# Patient Record
Sex: Male | Born: 1960 | Race: Black or African American | Hispanic: No | Marital: Married | Smoking: Current some day smoker
Health system: Southern US, Community
[De-identification: ages and names within clinical notes are randomized; demographics above are authoritative.]

## PROBLEM LIST (undated history)

## (undated) DIAGNOSIS — N529 Male erectile dysfunction, unspecified: Secondary | ICD-10-CM

## (undated) DIAGNOSIS — L851 Acquired keratosis [keratoderma] palmaris et plantaris: Secondary | ICD-10-CM

## (undated) DIAGNOSIS — M545 Low back pain: Secondary | ICD-10-CM

## (undated) DIAGNOSIS — K219 Gastro-esophageal reflux disease without esophagitis: Secondary | ICD-10-CM

## (undated) DIAGNOSIS — R9431 Abnormal electrocardiogram [ECG] [EKG]: Secondary | ICD-10-CM

## (undated) DIAGNOSIS — E785 Hyperlipidemia, unspecified: Secondary | ICD-10-CM

## (undated) DIAGNOSIS — N4889 Other specified disorders of penis: Secondary | ICD-10-CM

## (undated) DIAGNOSIS — E65 Localized adiposity: Secondary | ICD-10-CM

## (undated) DIAGNOSIS — R634 Abnormal weight loss: Secondary | ICD-10-CM

## (undated) DIAGNOSIS — I10 Essential (primary) hypertension: Secondary | ICD-10-CM

## (undated) DIAGNOSIS — J309 Allergic rhinitis, unspecified: Secondary | ICD-10-CM

## (undated) DIAGNOSIS — L2089 Other atopic dermatitis: Secondary | ICD-10-CM

## (undated) DIAGNOSIS — K259 Gastric ulcer, unspecified as acute or chronic, without hemorrhage or perforation: Secondary | ICD-10-CM

## (undated) HISTORY — DX: Low back pain: M54.5

## (undated) HISTORY — DX: Acquired keratosis (keratoderma) palmaris et plantaris: L85.1

## (undated) HISTORY — DX: Gastro-esophageal reflux disease without esophagitis: K21.9

## (undated) HISTORY — DX: Male erectile dysfunction, unspecified: N52.9

## (undated) HISTORY — DX: Abnormal weight loss: R63.4

## (undated) HISTORY — DX: Other atopic dermatitis: L20.89

## (undated) HISTORY — DX: Localized adiposity: E65

## (undated) HISTORY — DX: Hyperlipidemia, unspecified: E78.5

## (undated) HISTORY — DX: Abnormal electrocardiogram (ECG) (EKG): R94.31

## (undated) HISTORY — DX: Allergic rhinitis, unspecified: J30.9

## (undated) HISTORY — DX: Gastric ulcer, unspecified as acute or chronic, without hemorrhage or perforation: K25.9

## (undated) HISTORY — DX: Essential (primary) hypertension: I10

## (undated) HISTORY — DX: Other specified disorders of penis: N48.89

---

## 2005-01-24 ENCOUNTER — Ambulatory Visit: Payer: Self-pay | Admitting: Internal Medicine

## 2005-01-30 ENCOUNTER — Ambulatory Visit: Payer: Self-pay | Admitting: Internal Medicine

## 2005-06-25 ENCOUNTER — Ambulatory Visit: Payer: Self-pay | Admitting: Internal Medicine

## 2006-03-18 ENCOUNTER — Ambulatory Visit: Payer: Self-pay | Admitting: Internal Medicine

## 2006-04-21 ENCOUNTER — Ambulatory Visit: Payer: Self-pay | Admitting: Internal Medicine

## 2006-04-21 LAB — CONVERTED CEMR LAB: PSA: 1.46 ng/mL

## 2007-03-26 ENCOUNTER — Encounter: Payer: Self-pay | Admitting: Internal Medicine

## 2007-03-29 ENCOUNTER — Encounter: Payer: Self-pay | Admitting: Internal Medicine

## 2007-03-29 DIAGNOSIS — I10 Essential (primary) hypertension: Secondary | ICD-10-CM

## 2007-03-29 DIAGNOSIS — E785 Hyperlipidemia, unspecified: Secondary | ICD-10-CM

## 2007-03-29 DIAGNOSIS — E65 Localized adiposity: Secondary | ICD-10-CM

## 2007-03-29 DIAGNOSIS — M545 Low back pain, unspecified: Secondary | ICD-10-CM | POA: Insufficient documentation

## 2007-03-29 DIAGNOSIS — J309 Allergic rhinitis, unspecified: Secondary | ICD-10-CM | POA: Insufficient documentation

## 2007-03-29 HISTORY — DX: Localized adiposity: E65

## 2007-03-29 HISTORY — DX: Hyperlipidemia, unspecified: E78.5

## 2007-03-29 HISTORY — DX: Essential (primary) hypertension: I10

## 2007-03-29 HISTORY — DX: Low back pain, unspecified: M54.50

## 2007-03-29 HISTORY — DX: Allergic rhinitis, unspecified: J30.9

## 2007-05-26 ENCOUNTER — Ambulatory Visit: Payer: Self-pay | Admitting: Internal Medicine

## 2007-05-26 DIAGNOSIS — N4889 Other specified disorders of penis: Secondary | ICD-10-CM

## 2007-05-26 HISTORY — DX: Other specified disorders of penis: N48.89

## 2007-05-27 LAB — CONVERTED CEMR LAB
ALT: 30 units/L (ref 0–53)
Alkaline Phosphatase: 37 units/L — ABNORMAL LOW (ref 39–117)
BUN: 12 mg/dL (ref 6–23)
Basophils Absolute: 0 10*3/uL (ref 0.0–0.1)
Bilirubin Urine: NEGATIVE
Calcium: 9.5 mg/dL (ref 8.4–10.5)
Cholesterol: 219 mg/dL (ref 0–200)
Direct LDL: 130 mg/dL
Eosinophils Absolute: 0.1 10*3/uL (ref 0.0–0.6)
GFR calc Af Amer: 77 mL/min
GFR calc non Af Amer: 64 mL/min
HDL: 32.3 mg/dL — ABNORMAL LOW (ref >39.0)
Hemoglobin, Urine: NEGATIVE
Hemoglobin: 14.6 g/dL (ref 13.0–17.0)
Lymphocytes Relative: 24.5 % (ref 12.0–46.0)
MCHC: 34.6 g/dL (ref 30.0–36.0)
MCV: 81.3 fL (ref 78.0–100.0)
Monocytes Absolute: 0.5 10*3/uL (ref 0.2–0.7)
Monocytes Relative: 8.3 % (ref 3.0–11.0)
Neutro Abs: 4.4 10*3/uL (ref 1.4–7.7)
Platelets: 253 10*3/uL (ref 150–400)
Potassium: 3.7 meq/L (ref 3.5–5.1)
Specific Gravity, Urine: >=1.03 (ref 1.000–1.03)
TSH: 1.09 microintl units/mL (ref 0.35–5.50)
Total Protein, Urine: NEGATIVE mg/dL
Total Protein: 7.5 g/dL (ref 6.0–8.3)
Triglycerides: 549 mg/dL (ref 0–149)
Urine Glucose: NEGATIVE mg/dL

## 2007-08-27 ENCOUNTER — Ambulatory Visit: Payer: Self-pay | Admitting: Internal Medicine

## 2007-11-02 ENCOUNTER — Telehealth: Payer: Self-pay | Admitting: Internal Medicine

## 2008-01-05 ENCOUNTER — Telehealth (INDEPENDENT_AMBULATORY_CARE_PROVIDER_SITE_OTHER): Payer: Self-pay | Admitting: *Deleted

## 2008-11-08 ENCOUNTER — Telehealth: Payer: Self-pay | Admitting: Internal Medicine

## 2009-02-15 ENCOUNTER — Telehealth (INDEPENDENT_AMBULATORY_CARE_PROVIDER_SITE_OTHER): Payer: Self-pay | Admitting: *Deleted

## 2009-02-20 ENCOUNTER — Telehealth: Payer: Self-pay | Admitting: Internal Medicine

## 2009-02-23 ENCOUNTER — Ambulatory Visit: Payer: Self-pay | Admitting: Internal Medicine

## 2009-02-23 DIAGNOSIS — N529 Male erectile dysfunction, unspecified: Secondary | ICD-10-CM | POA: Insufficient documentation

## 2009-02-23 DIAGNOSIS — R9431 Abnormal electrocardiogram [ECG] [EKG]: Secondary | ICD-10-CM

## 2009-02-23 DIAGNOSIS — R0989 Other specified symptoms and signs involving the circulatory and respiratory systems: Secondary | ICD-10-CM

## 2009-02-23 DIAGNOSIS — R0609 Other forms of dyspnea: Secondary | ICD-10-CM

## 2009-02-23 HISTORY — DX: Male erectile dysfunction, unspecified: N52.9

## 2009-02-23 HISTORY — DX: Abnormal electrocardiogram (ECG) (EKG): R94.31

## 2009-03-13 ENCOUNTER — Telehealth (INDEPENDENT_AMBULATORY_CARE_PROVIDER_SITE_OTHER): Payer: Self-pay | Admitting: Radiology

## 2009-03-14 ENCOUNTER — Ambulatory Visit: Payer: Self-pay

## 2009-03-14 ENCOUNTER — Encounter: Payer: Self-pay | Admitting: Internal Medicine

## 2009-03-15 ENCOUNTER — Encounter: Payer: Self-pay | Admitting: Internal Medicine

## 2009-03-28 ENCOUNTER — Ambulatory Visit: Payer: Self-pay | Admitting: Internal Medicine

## 2010-08-22 ENCOUNTER — Encounter: Payer: Self-pay | Admitting: Internal Medicine

## 2010-08-22 ENCOUNTER — Ambulatory Visit (INDEPENDENT_AMBULATORY_CARE_PROVIDER_SITE_OTHER): Payer: BC Managed Care – PPO | Admitting: Internal Medicine

## 2010-08-22 DIAGNOSIS — I10 Essential (primary) hypertension: Secondary | ICD-10-CM

## 2010-08-22 DIAGNOSIS — L2089 Other atopic dermatitis: Secondary | ICD-10-CM

## 2010-08-22 DIAGNOSIS — N529 Male erectile dysfunction, unspecified: Secondary | ICD-10-CM

## 2010-08-22 HISTORY — DX: Other atopic dermatitis: L20.89

## 2010-08-30 NOTE — Assessment & Plan Note (Signed)
Summary: LAST OV W/DR TLJ:  2010--RASH/UPPER BODY--D/T---STC   Vital Signs:  Patient profile:   50 year old male Height:      72 inches Weight:      237 pounds BMI:     32.26 O2 Sat:      98 % on Room air Temp:     98.3 degrees F oral Pulse rate:   93 / minute Pulse rhythm:   regular Resp:     16 per minute BP sitting:   118 / 78  (left arm) Cuff size:   large  Vitals Entered By: Rock Nephew CMA (August 22, 2010 8:43 AM)  Nutrition Counseling: Patient's BMI is greater than 25 and therefore counseled on weight management options.  O2 Flow:  Room air  Primary Care Provider:  [name] MD  CC:  Rash.  History of Present Illness:  Rash      This is a 50 year old man who presents with Rash.  The symptoms began 4-6 months ago.  The severity is described as mild.  The patient reports papules and itching, but denies macules, nodules, hives, welts, pustules, blisters, ulcers, scaling, weeping, oozing, redness, increased warmth, and tenderness.  The rash is located on the chest.  The rash is worse with heat.  The patient denies the following symptoms: fever, headache, facial swelling, tongue swelling, burning, difficulty breathing, abdominal pain, nausea, vomiting, diarrhea, dizziness, sore throat, dysuria, eye symptoms, and arthralgias.  The patient denies history of recent tick bite, recent tick exposure, other insect bite, recent infection, recent antibiotic use, new medication, new clothing, new topical exposure, recent travel, pet/animal contact, thyroid disease, chronic liver disease, autoimmune disease, chronic edema, and prior STD.  He tells me that he went to see a Dermatologist, [employer], and had ? blood work done and was given ? cream and ? allergy medicine and he says that it did not help. He is scheduled to go back to Dermatology for a biopsy soon. He has not taken any steroids for this.  Preventive Screening-Counseling & Management  Alcohol-Tobacco     Alcohol  drinks/day: 0     Alcohol Counseling: not indicated; patient does not drink     Smoking Status: current     Packs/Day: cigars     Tobacco Counseling: to quit use of tobacco products  Hep-HIV-STD-Contraception     Hepatitis Risk: no risk noted     HIV Risk: no risk noted     STD Risk: no risk noted      Sexual History:  currently monogamous.        Drug Use:  no.        Blood Transfusions:  no.    Clinical Review Panels:  Prevention   Last PSA:  2.03 (05/26/2007)  Immunizations   Last Flu Vaccine:  Fluvax 3+ (04/21/2006)  Lipid Management   Cholesterol:  219 (05/26/2007)   LDL (bad choesterol):  DEL (05/26/2007)   HDL (good cholesterol):  32.3 (05/26/2007)  Diabetes Management   Creatinine:  1.3 (05/26/2007)   Last Flu Vaccine:  Fluvax 3+ (04/21/2006)  CBC   WBC:  6.6 (05/26/2007)   RBC:  5.19 (05/26/2007)   Hgb:  14.6 (05/26/2007)   Hct:  42.2 (05/26/2007)   Platelets:  253 (05/26/2007)   MCV  81.3 (05/26/2007)   MCHC  34.6 (05/26/2007)   RDW  13.8 (05/26/2007)   PMN:  64.4 (05/26/2007)   Lymphs:  24.5 (05/26/2007)   Monos:  8.3 (05/26/2007)  Eosinophils:  2.2 (06/18/07)   Basophil:  0.6 (06/18/2007)  Complete Metabolic Panel   Glucose:  110 (18-Jun-2007)   Sodium:  137 (2007-06-18)   Potassium:  3.7 (2007/06/18)   Chloride:  100 (06/18/07)   CO2:  28 (2007-06-18)   BUN:  12 (06-18-2007)   Creatinine:  1.3 (2007-06-18)   Albumin:  4.4 (06-18-2007)   Total Protein:  7.5 (06/18/07)   Calcium:  9.5 (2007-06-18)   Total Bili:  0.7 (06-18-2007)   Alk Phos:  37 (06-18-2007)   SGPT (ALT):  30 (06-18-07)   SGOT (AST):  25 (18-Jun-2007)   Medications Prior to Update: 1)  Ecotrin Low Strength 81 Mg  Tbec (Aspirin) .Marland Kitchen.. 1 By Mouth Qd 2)  Benicar 40 Mg Tabs (Olmesartan Medoxomil) .... Once Daily For High Blood Pressure 3)  Cialis 5 Mg Tabs (Tadalafil) .... Once Daily As Needed  Current Medications (verified): 1)  Ecotrin Low Strength 81 Mg  Tbec  (Aspirin) .Marland Kitchen.. 1 By Mouth Qd 2)  Benicar 40 Mg Tabs (Olmesartan Medoxomil) .... Once Daily For High Blood Pressure 3)  Medrol (Pak) 4 Mg Tabs (Methylprednisolone) .... Take As Directed 4)  Cetirizine Hcl 10 Mg Tabs (Cetirizine Hcl) .... One By Mouth Once Daily For Itching 5)  Viagra 100 Mg Tabs (Sildenafil Citrate) .... Take As Directed  Allergies (verified): 1)  ! Pcn 2)  ! * Codiene  Past History:  Past Medical History: Last updated: June 18, 2007 Allergic rhinitis Low back pain Obesity Hyperlipidemia Hypertension  Past Surgical History: Last updated: 03/26/2007 Denies surgical history  Family History: Last updated: 2007-06-18 father died with MI 59yo  Social History: Last updated: 02/23/2009 Current Smoker - occ cigar Alcohol use-yes Married Drug use-no Regular exercise-yes  Risk Factors: Alcohol Use: 0 (08/22/2010) Exercise: yes (02/23/2009)  Risk Factors: Smoking Status: current (08/22/2010) Packs/Day: cigars (08/22/2010)  Family History: Reviewed history from 06-18-2007 and no changes required. father died with MI 65yo  Social History: Reviewed history from 02/23/2009 and no changes required. Current Smoker - occ cigar Alcohol use-yes Married Drug use-no Regular exercise-yes Hepatitis Risk:  no risk noted HIV Risk:  no risk noted STD Risk:  no risk noted Sexual History:  currently monogamous Blood Transfusions:  no  Review of Systems       The patient complains of weight gain.  The patient denies anorexia, fever, weight loss, chest pain, syncope, dyspnea on exertion, peripheral edema, prolonged cough, headaches, hemoptysis, abdominal pain, hematuria, suspicious skin lesions, difficulty walking, depression, abnormal bleeding, enlarged lymph nodes, and angioedema.   GU:  Complains of erectile dysfunction; denies decreased libido, discharge, dysuria, genital sores, hematuria, incontinence, nocturia, urinary frequency, and urinary hesitancy.  Physical  Exam  General:  alert, well-developed, well-nourished, well-hydrated, appropriate dress, normal appearance, healthy-appearing, cooperative to examination, good hygiene, and overweight-appearing.   Head:  normocephalic, atraumatic, no abnormalities observed, and no abnormalities palpated.   Eyes:  vision grossly intact, pupils equal, and no injection.   Ears:  R ear normal and L ear normal.   Nose:  External nasal examination shows no deformity or inflammation. Nasal mucosa are pink and moist without lesions or exudates. Mouth:  Oral mucosa and oropharynx without lesions or exudates.  Teeth in good repair. Neck:  supple, full ROM, no masses, no carotid bruits, no cervical lymphadenopathy, and no neck tenderness.   Lungs:  Normal respiratory effort, chest expands symmetrically. Lungs are clear to auscultation, no crackles or wheezes. Heart:  Normal rate and regular rhythm. S1 and S2 normal  without gallop, murmur, click, rub or other extra sounds. Abdomen:  Bowel sounds positive,abdomen soft and non-tender without masses, organomegaly or hernias noted. Msk:  No deformity or scoliosis noted of thoracic or lumbar spine.   Pulses:  R and L carotid,radial,femoral,dorsalis pedis and posterior tibial pulses are full and equal bilaterally Extremities:  No clubbing, cyanosis, edema, or deformity noted with normal full range of motion of all joints.   Neurologic:  No cranial nerve deficits noted. Station and gait are normal. Plantar reflexes are down-going bilaterally. DTRs are symmetrical throughout. Sensory, motor and coordinative functions appear intact. Skin:  he has tiny papules scatterred over his torso but no scale, lichenification, targets, pustules, vesicles, excoriations, erythroderma, exudates, induration, or streaking. Cervical Nodes:  No lymphadenopathy noted Axillary Nodes:  No palpable lymphadenopathy Inguinal Nodes:  No significant adenopathy Psych:  Cognition and judgment appear intact.  Alert and cooperative with normal attention span and concentration. No apparent delusions, illusions, hallucinations   Impression & Recommendations:  Problem # 1:  ECZEMA, ATOPIC (ICD-691.8) Assessment New try medrol dose pak and zyrtec, return to Dermatology for biopsy  Problem # 2:  HYPERTENSION (ICD-401.9) Assessment: Improved  His updated medication list for this problem includes:    Benicar 40 Mg Tabs (Olmesartan medoxomil) ..... Once daily for high blood pressure  BP today: 118/78 Prior BP: 128/92 (02/23/2009)  Prior 10 Yr Risk Heart Disease: Not enough information (02/23/2009)  Labs Reviewed: K+: 3.7 (05/26/2007) Creat: : 1.3 (05/26/2007)   Chol: 219 (05/26/2007)   HDL: 32.3 (05/26/2007)   LDL: DEL (05/26/2007)   TG: 549 (05/26/2007)  Problem # 3:  ERECTILE DYSFUNCTION, SECONDARY TO MEDICATION (UEA-540.98) Assessment: Unchanged  The following medications were removed from the medication list:    Cialis 5 Mg Tabs (Tadalafil) ..... Once daily as needed His updated medication list for this problem includes:    Viagra 100 Mg Tabs (Sildenafil citrate) .Marland Kitchen... Take as directed  Discussed proper use of medications, as well as side effects.   Complete Medication List: 1)  Ecotrin Low Strength 81 Mg Tbec (Aspirin) .Marland Kitchen.. 1 by mouth qd 2)  Benicar 40 Mg Tabs (Olmesartan medoxomil) .... Once daily for high blood pressure 3)  Medrol (pak) 4 Mg Tabs (Methylprednisolone) .... Take as directed 4)  Cetirizine Hcl 10 Mg Tabs (Cetirizine hcl) .... One by mouth once daily for itching 5)  Viagra 100 Mg Tabs (Sildenafil citrate) .... Take as directed  Patient Instructions: 1)  Please schedule a follow-up appointment in 2 weeks. 2)  It is important that you exercise regularly at least 20 minutes 5 times a week. If you develop chest pain, have severe difficulty breathing, or feel very tired , stop exercising immediately and seek medical attention. 3)  You need to lose weight. Consider a lower  calorie diet and regular exercise.  Prescriptions: VIAGRA 100 MG TABS (SILDENAFIL CITRATE) Take as directed  #12 x 11   Entered and Authorized by:   [name] MD   Signed by:   [name] MD on 08/22/2010   Method used:   Print then Give to Patient   RxID:   [id number] CETIRIZINE HCL 10 MG TABS (CETIRIZINE HCL) One by mouth once daily for itching  #30 x 11   Entered and Authorized by:   [name] MD   Signed by:   [name] MD on 08/22/2010   Method used:   [facility] to        [facility]  [address] [facility]  [address] (retail)       [address]       [place], [place]  [address]       Ph: [phone] or [phone]       Fax: [phone]   RxID:   [id number] MEDROL (PAK) 4 MG TABS (METHYLPREDNISOLONE) Take as directed  #1 x 0   Entered and Authorized by:   [name] MD   Signed by:   [name] MD on 08/22/2010   Method used:   [facility]        [phone][facility] (retail)       [address]       [place], [place]  [address]       Ph: [phone] or [phone]       Fax: [phone]   RxID:   [id number]    Orders Added: 1)  Est. Patient Level V [15[address]]

## 2010-09-03 ENCOUNTER — Ambulatory Visit (INDEPENDENT_AMBULATORY_CARE_PROVIDER_SITE_OTHER): Payer: BC Managed Care – PPO | Admitting: Internal Medicine

## 2010-09-03 ENCOUNTER — Encounter: Payer: Self-pay | Admitting: Internal Medicine

## 2010-09-03 DIAGNOSIS — L851 Acquired keratosis [keratoderma] palmaris et plantaris: Secondary | ICD-10-CM

## 2010-09-03 DIAGNOSIS — L2089 Other atopic dermatitis: Secondary | ICD-10-CM

## 2010-09-03 HISTORY — DX: Acquired keratosis (keratoderma) palmaris et plantaris: L85.1

## 2010-09-07 ENCOUNTER — Ambulatory Visit: Payer: BC Managed Care – PPO | Admitting: Internal Medicine

## 2010-09-11 NOTE — Assessment & Plan Note (Signed)
Summary: 2 WK FU [name]  #   Vital Signs:  Patient profile:   50 year old male Height:      72 inches Weight:      236 pounds BMI:     32.12 O2 Sat:      97 % on Room air Temp:     98.4 degrees F oral Pulse rate:   84 / minute Pulse rhythm:   regular Resp:     16 per minute BP sitting:   118 / 70  (left arm) Cuff size:   large  Vitals Entered By: Rock Nephew CMA (September 03, 2010 9:09 AM)  Nutrition Counseling: Patient's BMI is greater than 25 and therefore counseled on weight management options.  O2 Flow:  Room air CC: follow-up visit 2wks, Hypertension Management Is Patient Diabetic? No Pain Assessment Patient in pain? no       Does patient need assistance? Functional Status Self care Ambulation Normal   Primary Care Provider:  [name] MD  CC:  follow-up visit 2wks and Hypertension Management.  History of Present Illness: He returns for f/up and tells me that the itching and scaling has improved but now he is concerned about dark spots across his chest, torso, and back. He is using ? steroid cream given to him by his Dermatologist. The zyrtec helps with the itching.  Hypertension History:      He denies headache, chest pain, palpitations, dyspnea with exertion, orthopnea, PND, peripheral edema, visual symptoms, neurologic problems, syncope, and side effects from treatment.  He notes no problems with any antihypertensive medication side effects.        Positive major cardiovascular risk factors include male age 77 years old or older, hyperlipidemia, hypertension, and current tobacco user.  Negative major cardiovascular risk factors include no history of diabetes and negative family history for ischemic heart disease.        Positive history for target organ damage include cardiac end organ damage (either CHF or LVH).  Further assessment for target organ damage reveals no history of ASHD, stroke/TIA, peripheral vascular disease, renal insufficiency, or hypertensive  retinopathy.    Preventive Screening-Counseling & Management  Alcohol-Tobacco     Alcohol drinks/day: 0     Alcohol Counseling: not indicated; patient does not drink     Smoking Status: current     Packs/Day: cigars     Tobacco Counseling: to quit use of tobacco products  Clinical Review Panels:  Prevention   Last PSA:  2.03 (05/26/2007)  Immunizations   Last Flu Vaccine:  Fluvax 3+ (04/21/2006)  Lipid Management   Cholesterol:  219 (05/26/2007)   LDL (bad choesterol):  DEL (05/26/2007)   HDL (good cholesterol):  32.3 (05/26/2007)  Diabetes Management   Creatinine:  1.3 (05/26/2007)   Last Flu Vaccine:  Fluvax 3+ (04/21/2006)  CBC   WBC:  6.6 (05/26/2007)   RBC:  5.19 (05/26/2007)   Hgb:  14.6 (05/26/2007)   Hct:  42.2 (05/26/2007)   Platelets:  253 (05/26/2007)   MCV  81.3 (05/26/2007)   MCHC  34.6 (05/26/2007)   RDW  13.8 (05/26/2007)   PMN:  64.4 (05/26/2007)   Lymphs:  24.5 (05/26/2007)   Monos:  8.3 (05/26/2007)   Eosinophils:  2.2 (05/26/2007)   Basophil:  0.6 (05/26/2007)  Complete Metabolic Panel   Glucose:  110 (05/26/2007)   Sodium:  137 (05/26/2007)   Potassium:  3.7 (05/26/2007)   Chloride:  100 (05/26/2007)   CO2:  28 (05/26/2007)  BUN:  12 (2007-05-31)   Creatinine:  1.3 (2007-05-31)   Albumin:  4.4 (31-May-2007)   Total Protein:  7.5 (2007-05-31)   Calcium:  9.5 (05-31-07)   Total Bili:  0.7 (05/31/07)   Alk Phos:  37 (May 31, 2007)   SGPT (ALT):  30 (2007/05/31)   SGOT (AST):  25 (May 31, 2007)   Medications Prior to Update: 1)  Ecotrin Low Strength 81 Mg  Tbec (Aspirin) .Marland Kitchen.. 1 By Mouth Qd 2)  Benicar 40 Mg Tabs (Olmesartan Medoxomil) .... Once Daily For High Blood Pressure 3)  Medrol (Pak) 4 Mg Tabs (Methylprednisolone) .... Take As Directed 4)  Cetirizine Hcl 10 Mg Tabs (Cetirizine Hcl) .... One By Mouth Once Daily For Itching 5)  Viagra 100 Mg Tabs (Sildenafil Citrate) .... Take As Directed  Current Medications (verified): 1)   Ecotrin Low Strength 81 Mg  Tbec (Aspirin) .Marland Kitchen.. 1 By Mouth Qd 2)  Benicar 40 Mg Tabs (Olmesartan Medoxomil) .... Once Daily For High Blood Pressure 3)  Medrol (Pak) 4 Mg Tabs (Methylprednisolone) .... Take As Directed 4)  Cetirizine Hcl 10 Mg Tabs (Cetirizine Hcl) .... One By Mouth Once Daily For Itching 5)  Viagra 100 Mg Tabs (Sildenafil Citrate) .... Take As Directed 6)  Lac-Hydrin Twelve 12 % Lotn (Ammonium Lactate) .... Apply To Aa Once Daily Prn  Allergies (verified): 1)  ! Pcn 2)  ! * Codiene  Past History:  Past Medical History: Last updated: May 31, 2007 Allergic rhinitis Low back pain Obesity Hyperlipidemia Hypertension  Past Surgical History: Last updated: 03/26/2007 Denies surgical history  Family History: Last updated: 2007-05-31 father died with MI 59yo  Social History: Last updated: 02/23/2009 Current Smoker - occ cigar Alcohol use-yes Married Drug use-no Regular exercise-yes  Risk Factors: Alcohol Use: 0 (09/03/2010) Exercise: yes (02/23/2009)  Risk Factors: Smoking Status: current (09/03/2010) Packs/Day: cigars (09/03/2010)  Family History: Reviewed history from 2007-05-31 and no changes required. father died with MI 33yo  Social History: Reviewed history from 02/23/2009 and no changes required. Current Smoker - occ cigar Alcohol use-yes Married Drug use-no Regular exercise-yes  Review of Systems Derm:  Complains of changes in color of skin, dryness, and rash; denies changes in nail beds, excessive perspiration, flushing, hair loss, itching, lesion(s), and poor wound healing.  Physical Exam  General:  alert, well-developed, well-nourished, well-hydrated, appropriate dress, normal appearance, healthy-appearing, cooperative to examination, good hygiene, and overweight-appearing.   Mouth:  Oral mucosa and oropharynx without lesions or exudates.  Teeth in good repair. Neck:  supple, full ROM, no masses, no carotid bruits, no cervical  lymphadenopathy, and no neck tenderness.   Lungs:  Normal respiratory effort, chest expands symmetrically. Lungs are clear to auscultation, no crackles or wheezes. Heart:  Normal rate and regular rhythm. S1 and S2 normal without gallop, murmur, click, rub or other extra sounds. Abdomen:  Bowel sounds positive,abdomen soft and non-tender without masses, organomegaly or hernias noted. Msk:  No deformity or scoliosis noted of thoracic or lumbar spine.   Pulses:  R and L carotid,radial,femoral,dorsalis pedis and posterior tibial pulses are full and equal bilaterally Extremities:  No clubbing, cyanosis, edema, or deformity noted with normal full range of motion of all joints.   Neurologic:  No cranial nerve deficits noted. Station and gait are normal. Plantar reflexes are down-going bilaterally. DTRs are symmetrical throughout. Sensory, motor and coordinative functions appear intact. Skin:  most of the scale has resolved but now he has perifollicular PIPA across his chest and torso. Psych:  Cognition and judgment appear intact.  Alert and cooperative with normal attention span and concentration. 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 Apply to aa once daily prn  Problem # 3:  HYPERTENSION (ICD-401.9) Assessment: Improved  His updated medication list for this problem includes:    Benicar 40 Mg Tabs (Olmesartan medoxomil) ..... Once daily for high blood pressure  BP today: 118/70 Prior BP: 118/78 (08/22/2010)  Prior 10 Yr Risk Heart Disease: Not enough information (02/23/2009)  Labs Reviewed: K+: 3.7 (05/26/2007) Creat: : 1.3 (05/26/2007)   Chol: 219 (05/26/2007)   HDL: 32.3  (05/26/2007)   LDL: DEL (05/26/2007)   TG: 549 (05/26/2007)  Complete Medication List: 1)  Ecotrin Low Strength 81 Mg Tbec (Aspirin) .Marland Kitchen.. 1 by mouth qd 2)  Benicar 40 Mg Tabs (Olmesartan medoxomil) .... Once daily for high blood pressure 3)  Medrol (pak) 4 Mg Tabs (Methylprednisolone) .... Take as directed 4)  Cetirizine Hcl 10 Mg Tabs (Cetirizine hcl) .... One by mouth once daily for itching 5)  Viagra 100 Mg Tabs (Sildenafil citrate) .... Take as directed 6)  Lac-hydrin Twelve 12 % Lotn (Ammonium lactate) .... Apply to aa once daily prn  Hypertension Assessment/Plan:      The patient's hypertensive risk group is category C: Target organ damage and/or diabetes.  Today's blood pressure is 118/70.  His blood pressure goal is < 140/90.   Patient Instructions: 1)  Please schedule a follow-up appointment in 2 months. 2)  It is important that you exercise regularly at least 20 minutes 5 times a week. If you develop chest pain, have severe difficulty breathing, or feel very tired , stop exercising immediately and seek medical attention. 3)  You need to lose weight. Consider a lower calorie diet and regular exercise.  Prescriptions: LAC-HYDRIN TWELVE 12 % LOTN (AMMONIUM LACTATE) Apply to AA once daily prn  #1 bottle x 11   Entered and Authorized by:   Howard Grandchild MD   Signed by:   Howard Grandchild MD on 09/03/2010   Method used:   [phone][facility] (retail)       [address]       [place], [place]  [address]       Ph: [phone] or [phone]       Fax: [phone]   RxID:   [id number]    Orders Added: 1)  Est. Patient Level IV [59563]

## 2011-02-10 ENCOUNTER — Other Ambulatory Visit: Payer: Self-pay | Admitting: Internal Medicine

## 2011-06-27 ENCOUNTER — Ambulatory Visit (INDEPENDENT_AMBULATORY_CARE_PROVIDER_SITE_OTHER): Payer: 59 | Admitting: Internal Medicine

## 2011-06-27 ENCOUNTER — Other Ambulatory Visit (INDEPENDENT_AMBULATORY_CARE_PROVIDER_SITE_OTHER): Payer: 59

## 2011-06-27 ENCOUNTER — Other Ambulatory Visit: Payer: Self-pay | Admitting: Internal Medicine

## 2011-06-27 ENCOUNTER — Encounter: Payer: Self-pay | Admitting: Internal Medicine

## 2011-06-27 VITALS — BP 126/78 | HR 86 | Temp 97.6°F | Resp 16

## 2011-06-27 DIAGNOSIS — E785 Hyperlipidemia, unspecified: Secondary | ICD-10-CM

## 2011-06-27 DIAGNOSIS — Z0000 Encounter for general adult medical examination without abnormal findings: Secondary | ICD-10-CM

## 2011-06-27 DIAGNOSIS — N400 Enlarged prostate without lower urinary tract symptoms: Secondary | ICD-10-CM

## 2011-06-27 DIAGNOSIS — I10 Essential (primary) hypertension: Secondary | ICD-10-CM

## 2011-06-27 DIAGNOSIS — Z23 Encounter for immunization: Secondary | ICD-10-CM

## 2011-06-27 DIAGNOSIS — R10817 Generalized abdominal tenderness: Secondary | ICD-10-CM

## 2011-06-27 LAB — CBC WITH DIFFERENTIAL/PLATELET
Basophils Absolute: 0 10*3/uL (ref 0.0–0.1)
HCT: 42.1 % (ref 39.0–52.0)
Lymphs Abs: 0.9 10*3/uL (ref 0.7–4.0)
MCV: 83.1 fl (ref 78.0–100.0)
Monocytes Absolute: 0.5 10*3/uL (ref 0.1–1.0)
Monocytes Relative: 9.1 % (ref 3.0–12.0)
Neutrophils Relative %: 74.2 % (ref 43.0–77.0)
Platelets: 209 10*3/uL (ref 150.0–400.0)
RDW: 14.6 % (ref 11.5–14.6)

## 2011-06-27 LAB — URINALYSIS, ROUTINE W REFLEX MICROSCOPIC
Ketones, ur: NEGATIVE
Specific Gravity, Urine: 1.02 (ref 1.000–1.030)
Total Protein, Urine: NEGATIVE
Urine Glucose: NEGATIVE
pH: 7 (ref 5.0–8.0)

## 2011-06-27 LAB — COMPREHENSIVE METABOLIC PANEL
ALT: 32 U/L (ref 0–53)
Alkaline Phosphatase: 43 U/L (ref 39–117)
Sodium: 140 mEq/L (ref 135–145)
Total Bilirubin: 0.6 mg/dL (ref 0.3–1.2)
Total Protein: 6.6 g/dL (ref 6.0–8.3)

## 2011-06-27 LAB — LIPID PANEL
Cholesterol: 167 mg/dL (ref 0–200)
HDL: 35.9 mg/dL — ABNORMAL LOW (ref >39.00)
Total CHOL/HDL Ratio: 5
Triglycerides: 365 mg/dL — ABNORMAL HIGH (ref 0.0–149.0)

## 2011-06-27 LAB — LIPASE: Lipase: 29 U/L (ref 11.0–59.0)

## 2011-06-27 LAB — AMYLASE: Amylase: 60 U/L (ref 27–131)

## 2011-06-27 NOTE — Patient Instructions (Signed)

## 2011-06-27 NOTE — Progress Notes (Signed)
Subjective:    Patient ID: Howard [name], male    DOB: 23-Jul-1960, 51 y.o.   MRN: [id number]  Abdominal Pain This is a new problem. Episode onset: one month. The onset quality is gradual. The problem occurs intermittently. The most recent episode lasted 1 month. The problem has been unchanged. The pain is located in the epigastric region. The pain is at a severity of 2/10. The pain is mild. The quality of the pain is aching and burning. The abdominal pain does not radiate. Associated symptoms include weight loss. Pertinent negatives include no anorexia, arthralgias, belching, constipation, diarrhea, dysuria, fever, flatus, frequency, headaches, hematochezia, hematuria, melena, myalgias, nausea or vomiting. The pain is aggravated by nothing. The pain is relieved by eating. He has tried nothing for the symptoms.  Hypertension This is a chronic problem. The current episode started more than 1 year ago. The problem has been gradually improving since onset. The problem is controlled. Pertinent negatives include no anxiety, blurred vision, chest pain, headaches, malaise/fatigue, neck pain, orthopnea, palpitations, peripheral edema, PND, shortness of breath or sweats. There are no associated agents to hypertension. Past treatments include angiotensin blockers and diuretics. The current treatment provides significant improvement. Compliance problems include exercise and diet.       Review of Systems  Constitutional: Positive for weight loss. Negative for fever, chills, malaise/fatigue, diaphoresis, activity change, appetite change, fatigue and unexpected weight change.  HENT: Negative for nosebleeds, congestion, sore throat, facial swelling, rhinorrhea, trouble swallowing, neck pain, neck stiffness, voice change and ear discharge.   Eyes: Negative.  Negative for blurred vision.  Respiratory: Negative for cough, chest tightness, shortness of breath, wheezing and stridor.   Cardiovascular: Negative for  chest pain, palpitations, orthopnea, leg swelling and PND.  Gastrointestinal: Positive for abdominal pain. Negative for nausea, vomiting, diarrhea, constipation, blood in stool, melena, hematochezia, abdominal distention, anal bleeding, rectal pain, anorexia and flatus.  Genitourinary: Negative for dysuria, urgency, frequency, hematuria, flank pain, decreased urine volume, discharge, penile swelling, scrotal swelling, enuresis, difficulty urinating, genital sores, penile pain and testicular pain.  Musculoskeletal: Negative for myalgias, back pain, joint swelling, arthralgias and gait problem.  Skin: Negative for color change, pallor, rash and wound.  Neurological: Negative for dizziness, tremors, seizures, syncope, facial asymmetry, speech difficulty, light-headedness, numbness and headaches.  Hematological: Negative for adenopathy. Does not bruise/bleed easily.  Psychiatric/Behavioral: Negative.        Objective:   Physical Exam  Vitals reviewed. Constitutional: He is oriented to person, place, and time. He appears well-developed and well-nourished. No distress.  HENT:  Head: Normocephalic and atraumatic.  Mouth/Throat: Oropharynx is clear and moist. No oropharyngeal exudate.  Eyes: Conjunctivae are normal. Right eye exhibits no discharge. No scleral icterus.  Neck: Normal range of motion. Neck supple. No JVD present. No tracheal deviation present. No thyromegaly present.  Cardiovascular: Normal rate, regular rhythm, normal heart sounds and intact distal pulses.  Exam reveals no gallop and no friction rub.   No murmur heard. Pulmonary/Chest: Effort normal and breath sounds normal. No stridor. No respiratory distress. He has no wheezes. He has no rales. He exhibits no tenderness.  Abdominal: Soft. Bowel sounds are normal. He exhibits no distension and no mass. There is no tenderness. There is no rebound and no guarding. Hernia confirmed negative in the right inguinal area and confirmed  negative in the left inguinal area.  Genitourinary: Rectum normal, prostate normal, testes normal and penis normal. Rectal exam shows no external hemorrhoid, no internal hemorrhoid, no fissure, no mass,  no tenderness and anal tone normal. Guaiac negative stool. Prostate is not enlarged and not tender. Right testis shows no mass, no swelling and no tenderness. Right testis is descended. Left testis shows no mass, no swelling and no tenderness. Left testis is descended. Uncircumcised. No phimosis, paraphimosis, hypospadias, penile erythema or penile tenderness. No discharge found.  Musculoskeletal: Normal range of motion. He exhibits no edema and no tenderness.  Lymphadenopathy:    He has no cervical adenopathy.       Right: No inguinal adenopathy present.       Left: No inguinal adenopathy present.  Neurological: He is oriented to person, place, and time.  Skin: Skin is warm and dry. No rash noted. He is not diaphoretic. No erythema. No pallor.  Psychiatric: He has a normal mood and affect. His behavior is normal. Judgment and thought content normal.      Lab Results  Component Value Date   WBC 6.6 05/26/2007   HGB 14.6 05/26/2007   HCT 42.2 05/26/2007   PLT 253 05/26/2007   GLUCOSE 110* 05/26/2007   CHOL 219* 05/26/2007   TRIG 549* 05/26/2007   HDL 32.3* 05/26/2007   LDLDIRECT 130.0 05/26/2007   ALT 30 05/26/2007   AST 25 05/26/2007   NA 137 05/26/2007   K 3.7 05/26/2007   CL 100 05/26/2007   CREATININE 1.3 05/26/2007   BUN 12 05/26/2007   CO2 28 05/26/2007   TSH 1.09 05/26/2007   PSA 2.03 05/26/2007      Assessment & Plan:

## 2011-06-28 ENCOUNTER — Encounter: Payer: Self-pay | Admitting: Internal Medicine

## 2011-06-30 DIAGNOSIS — Z0000 Encounter for general adult medical examination without abnormal findings: Secondary | ICD-10-CM | POA: Insufficient documentation

## 2011-06-30 NOTE — Assessment & Plan Note (Signed)
Exam done, labs ordered, vaccines updated

## 2011-06-30 NOTE — Assessment & Plan Note (Signed)
I will recheck his lipids today  

## 2011-06-30 NOTE — Assessment & Plan Note (Signed)
I will check his PSA level today 

## 2011-06-30 NOTE — Assessment & Plan Note (Signed)
He has had a vague abd pain for about one month, I do not see anything on exam today to explain this, I will check his labs today to look for systemic/orgainc causes

## 2011-06-30 NOTE — Assessment & Plan Note (Signed)
His BP is well controlled, I will check his lytes and renal function today 

## 2011-07-02 ENCOUNTER — Telehealth: Payer: Self-pay

## 2011-07-02 NOTE — Telephone Encounter (Signed)
Called [facility] at [phone], spoke with Molly Maduro who approved Benicar HCT (case ID [id number]). Patient notified

## 2011-09-03 ENCOUNTER — Other Ambulatory Visit: Payer: Self-pay | Admitting: Internal Medicine

## 2011-09-27 ENCOUNTER — Encounter: Payer: Self-pay | Admitting: Internal Medicine

## 2011-09-27 ENCOUNTER — Ambulatory Visit (INDEPENDENT_AMBULATORY_CARE_PROVIDER_SITE_OTHER): Payer: 59 | Admitting: Internal Medicine

## 2011-09-27 ENCOUNTER — Other Ambulatory Visit (INDEPENDENT_AMBULATORY_CARE_PROVIDER_SITE_OTHER): Payer: 59

## 2011-09-27 VITALS — BP 114/68 | HR 82 | Temp 98.4°F | Resp 16 | Wt 215.0 lb

## 2011-09-27 DIAGNOSIS — R6881 Early satiety: Secondary | ICD-10-CM

## 2011-09-27 DIAGNOSIS — R10817 Generalized abdominal tenderness: Secondary | ICD-10-CM

## 2011-09-27 DIAGNOSIS — E785 Hyperlipidemia, unspecified: Secondary | ICD-10-CM

## 2011-09-27 DIAGNOSIS — I10 Essential (primary) hypertension: Secondary | ICD-10-CM

## 2011-09-27 LAB — COMPREHENSIVE METABOLIC PANEL
AST: 21 U/L (ref 0–37)
Alkaline Phosphatase: 42 U/L (ref 39–117)
BUN: 12 mg/dL (ref 6–23)
Creatinine, Ser: 1.3 mg/dL (ref 0.4–1.5)
Glucose, Bld: 97 mg/dL (ref 70–99)
Potassium: 3.7 mEq/L (ref 3.5–5.1)
Total Bilirubin: 0.6 mg/dL (ref 0.3–1.2)

## 2011-09-27 LAB — CBC WITH DIFFERENTIAL/PLATELET
Basophils Relative: 0.5 % (ref 0.0–3.0)
Eosinophils Absolute: 0.2 10*3/uL (ref 0.0–0.7)
Eosinophils Relative: 2.5 % (ref 0.0–5.0)
HCT: 43.8 % (ref 39.0–52.0)
Lymphs Abs: 1.3 10*3/uL (ref 0.7–4.0)
MCHC: 33.4 g/dL (ref 30.0–36.0)
MCV: 82.3 fl (ref 78.0–100.0)
Monocytes Absolute: 0.7 10*3/uL (ref 0.1–1.0)
Neutro Abs: 4.9 10*3/uL (ref 1.4–7.7)
Neutrophils Relative %: 68.6 % (ref 43.0–77.0)
RBC: 5.33 Mil/uL (ref 4.22–5.81)

## 2011-09-27 LAB — AMYLASE: Amylase: 57 U/L (ref 27–131)

## 2011-09-27 LAB — LDL CHOLESTEROL, DIRECT: Direct LDL: 90.3 mg/dL

## 2011-09-27 LAB — LIPASE: Lipase: 27 U/L (ref 11.0–59.0)

## 2011-09-27 MED ORDER — ESOMEPRAZOLE MAGNESIUM 40 MG PO CPDR: 40.0000 mg | DELAYED_RELEASE_CAPSULE | Freq: Every day | ORAL | Status: DC

## 2011-09-27 NOTE — Patient Instructions (Signed)

## 2011-09-27 NOTE — Progress Notes (Signed)
Subjective:    Patient ID: Howard [name], male    DOB: 07/31/1959, 51 y.o.   MRN: [id number]  Abdominal Pain This is a recurrent problem. The current episode started more than 1 month ago. The onset quality is gradual. The problem occurs intermittently. The problem has been unchanged. The pain is located in the epigastric region. The pain is at a severity of 2/10. The pain is mild. The quality of the pain is dull. The abdominal pain does not radiate. Associated symptoms include anorexia, melena and weight loss. Pertinent negatives include no arthralgias, belching, constipation, diarrhea, dysuria, fever, flatus, frequency, headaches, hematochezia, hematuria, myalgias, nausea or vomiting. The pain is aggravated by nothing. Relieved by: pepto-bismol. He has tried antacids for the symptoms. The treatment provided moderate relief.      Review of Systems  Constitutional: Positive for weight loss and unexpected weight change (intentional weight loss with herbal diet meds). Negative for fever, chills, diaphoresis, activity change, appetite change and fatigue.  HENT: Negative.   Eyes: Negative.   Respiratory: Negative for apnea, cough, choking, chest tightness, shortness of breath, wheezing and stridor.   Cardiovascular: Negative for chest pain and leg swelling.  Gastrointestinal: Positive for abdominal pain, melena and anorexia. Negative for nausea, vomiting, diarrhea, constipation, blood in stool, hematochezia, abdominal distention, anal bleeding, rectal pain and flatus.  Genitourinary: Negative for dysuria, urgency, frequency, hematuria, flank pain, decreased urine volume, discharge, penile swelling, scrotal swelling, enuresis, difficulty urinating, genital sores, penile pain and testicular pain.  Musculoskeletal: Negative for myalgias, back pain, joint swelling, arthralgias and gait problem.  Skin: Negative for color change, pallor, rash and wound.  Neurological: Negative for dizziness, tremors,  seizures, syncope, facial asymmetry, speech difficulty, weakness, light-headedness, numbness and headaches.  Hematological: Negative for adenopathy. Does not bruise/bleed easily.  Psychiatric/Behavioral: Negative.        Objective:   Physical Exam  Vitals reviewed. Constitutional: He is oriented to person, place, and time. He appears well-developed and well-nourished. No distress.  HENT:  Head: Normocephalic and atraumatic.  Mouth/Throat: Oropharynx is clear and moist. No oropharyngeal exudate.  Eyes: Conjunctivae are normal. Right eye exhibits no discharge. Left eye exhibits no discharge. No scleral icterus.  Neck: Normal range of motion. Neck supple. No JVD present. No tracheal deviation present. No thyromegaly present.  Cardiovascular: Normal rate, regular rhythm, normal heart sounds and intact distal pulses.  Exam reveals no gallop and no friction rub.   No murmur heard. Pulmonary/Chest: Effort normal and breath sounds normal. No stridor. No respiratory distress. He has no wheezes. He has no rales. He exhibits no tenderness.  Abdominal: Soft. Bowel sounds are normal. He exhibits no distension and no mass. There is no tenderness. There is no rebound and no guarding.  Genitourinary:       This exam was refused by him  Musculoskeletal: Normal range of motion. He exhibits no edema and no tenderness.  Lymphadenopathy:    He has no cervical adenopathy.  Neurological: He is oriented to person, place, and time.  Skin: Skin is warm and dry. No rash noted. He is not diaphoretic. No erythema. No pallor.  Psychiatric: He has a normal mood and affect. His behavior is normal. Judgment and thought content normal.      Lab Results  Component Value Date   WBC 6.0 06/27/2011   HGB 14.2 06/27/2011   HCT 42.1 06/27/2011   PLT 209.0 06/27/2011   GLUCOSE 93 06/27/2011   CHOL 167 06/27/2011   TRIG 365.0* 06/27/2011  HDL 35.90* 06/27/2011   LDLDIRECT 69.1 06/27/2011   ALT 32 06/27/2011   AST 26 06/27/2011   NA  140 06/27/2011   K 4.1 06/27/2011   CL 102 06/27/2011   CREATININE 1.2 06/27/2011   BUN 16 06/27/2011   CO2 30 06/27/2011   TSH 1.37 06/27/2011   PSA 4.76* 06/27/2011      Assessment & Plan:

## 2011-09-29 NOTE — Assessment & Plan Note (Signed)
I will recheck his FLP today to see if his trigs are high enough to cause pancreatitis

## 2011-09-29 NOTE — Assessment & Plan Note (Addendum)
I will check his labs today to look for organic issues and I have asked him to see GI to consider having an upper endoscopy done, I am concerned that he may have PUD or GERD so I have started him on a PPI as well

## 2011-09-29 NOTE — Assessment & Plan Note (Signed)
I will check his labs today to look for orgainc pathology (anemia, pancreatitis, etc.)

## 2011-09-29 NOTE — Assessment & Plan Note (Signed)
His BP is well controlled, I will check his lytes and renal function today 

## 2011-10-07 ENCOUNTER — Encounter: Payer: Self-pay | Admitting: Gastroenterology

## 2011-10-07 ENCOUNTER — Encounter: Payer: Self-pay | Admitting: Internal Medicine

## 2011-10-07 ENCOUNTER — Ambulatory Visit (INDEPENDENT_AMBULATORY_CARE_PROVIDER_SITE_OTHER): Payer: 59 | Admitting: Internal Medicine

## 2011-10-07 ENCOUNTER — Other Ambulatory Visit (INDEPENDENT_AMBULATORY_CARE_PROVIDER_SITE_OTHER): Payer: 59

## 2011-10-07 ENCOUNTER — Ambulatory Visit (INDEPENDENT_AMBULATORY_CARE_PROVIDER_SITE_OTHER)
Admission: RE | Admit: 2011-10-07 | Discharge: 2011-10-07 | Disposition: A | Discharge status: Home / Self Care | Payer: 59 | Source: Ambulatory Visit | Attending: Internal Medicine | Admitting: Internal Medicine

## 2011-10-07 VITALS — BP 92/60 | HR 99 | Temp 98.2°F | Resp 16 | Wt 213.5 lb

## 2011-10-07 DIAGNOSIS — R10817 Generalized abdominal tenderness: Secondary | ICD-10-CM

## 2011-10-07 DIAGNOSIS — R6881 Early satiety: Secondary | ICD-10-CM

## 2011-10-07 DIAGNOSIS — J309 Allergic rhinitis, unspecified: Secondary | ICD-10-CM

## 2011-10-07 LAB — COMPREHENSIVE METABOLIC PANEL
ALT: 30 U/L (ref 0–53)
AST: 25 U/L (ref 0–37)
Alkaline Phosphatase: 38 U/L — ABNORMAL LOW (ref 39–117)
BUN: 13 mg/dL (ref 6–23)
Calcium: 9.1 mg/dL (ref 8.4–10.5)
Chloride: 103 mEq/L (ref 96–112)
Creatinine, Ser: 1.4 mg/dL (ref 0.4–1.5)
Potassium: 4 mEq/L (ref 3.5–5.1)

## 2011-10-07 LAB — CBC WITH DIFFERENTIAL/PLATELET
Basophils Absolute: 0 10*3/uL (ref 0.0–0.1)
Basophils Relative: 0.3 % (ref 0.0–3.0)
Eosinophils Absolute: 0.2 10*3/uL (ref 0.0–0.7)
Lymphocytes Relative: 19.1 % (ref 12.0–46.0)
MCHC: 33.5 g/dL (ref 30.0–36.0)
MCV: 82 fl (ref 78.0–100.0)
Monocytes Absolute: 0.6 10*3/uL (ref 0.1–1.0)
Neutrophils Relative %: 66.7 % (ref 43.0–77.0)
RDW: 14.5 % (ref 11.5–14.6)

## 2011-10-07 LAB — URINALYSIS, ROUTINE W REFLEX MICROSCOPIC
Bilirubin Urine: NEGATIVE
Hgb urine dipstick: NEGATIVE
Ketones, ur: NEGATIVE
Leukocytes, UA: NEGATIVE
Nitrite: NEGATIVE
Specific Gravity, Urine: 1.015 (ref 1.000–1.030)
Total Protein, Urine: NEGATIVE
Urine Glucose: NEGATIVE
Urobilinogen, UA: 0.2 (ref 0.0–1.0)
pH: 6 (ref 5.0–8.0)

## 2011-10-07 LAB — AMYLASE: Amylase: 60 U/L (ref 27–131)

## 2011-10-07 MED ORDER — IOHEXOL 300 MG/ML  SOLN
100.0000 mL | Freq: Once | INTRAMUSCULAR | Status: AC | PRN
  Administered 2011-10-07: 100 mL via INTRAVENOUS

## 2011-10-07 MED ORDER — METHYLPREDNISOLONE 4 MG PO KIT: PACK | ORAL | Status: AC

## 2011-10-07 NOTE — Assessment & Plan Note (Signed)
Start medrol dose pak 

## 2011-10-07 NOTE — Assessment & Plan Note (Signed)
He needs to have ct scan done to look for pancreatitis, mass, renal stones, liver mass, gallstones, etc

## 2011-10-07 NOTE — Patient Instructions (Signed)

## 2011-10-07 NOTE — Progress Notes (Signed)
Subjective:    Patient ID: Howard [name], male    DOB: 07/31/1959, 51 y.o.   MRN: [id number]  Abdominal Pain This is a recurrent problem. The current episode started more than 1 month ago. The onset quality is gradual. The problem occurs intermittently. The most recent episode lasted 6 weeks. The problem has been unchanged. The pain is located in the RUQ. The pain is at a severity of 3/10. The pain is mild. The quality of the pain is aching. The abdominal pain does not radiate. Associated symptoms include nausea, vomiting and weight loss. Pertinent negatives include no anorexia, arthralgias, belching, constipation, diarrhea, dysuria, fever, flatus, frequency, headaches, hematochezia, hematuria, melena or myalgias. The pain is aggravated by nothing. The pain is relieved by nothing. He has tried proton pump inhibitors for the symptoms. The treatment provided no relief.  Allergic Reaction This is a recurrent problem. The current episode started more than 1 week ago. The problem occurs intermittently. The problem has been gradually worsening since onset. The problem is moderate. Associated with: pollen. The time of exposure was just prior to onset. Associated symptoms include abdominal pain and vomiting. Pertinent negatives include no chest pain, chest pressure, coughing, diarrhea, difficulty breathing, drooling, eye itching, eye redness, eye watering, globus sensation, hyperventilation, rash, stridor, trouble swallowing or wheezing. There is no swelling present. Past treatments include one or more OTC medications. The treatment provided no relief. His past medical history is significant for seasonal allergies.      Review of Systems  Constitutional: Positive for weight loss and unexpected weight change. Negative for fever, chills, diaphoresis, activity change, appetite change and fatigue.  HENT: Positive for congestion, rhinorrhea, sneezing and postnasal drip. Negative for hearing loss, ear pain,  nosebleeds, sore throat, facial swelling, drooling, mouth sores, trouble swallowing, neck pain, neck stiffness, dental problem, sinus pressure, tinnitus and ear discharge.   Eyes: Negative.  Negative for redness and itching.  Respiratory: Negative for cough, chest tightness, shortness of breath, wheezing and stridor.   Cardiovascular: Negative for chest pain, palpitations and leg swelling.  Gastrointestinal: Positive for nausea, vomiting and abdominal pain. Negative for diarrhea, constipation, blood in stool, melena, hematochezia, abdominal distention, anal bleeding, rectal pain, anorexia and flatus.  Genitourinary: Negative for dysuria, urgency, frequency, hematuria, flank pain, decreased urine volume, discharge, penile swelling, scrotal swelling, enuresis, difficulty urinating, genital sores, penile pain and testicular pain.  Musculoskeletal: Negative for myalgias, back pain, joint swelling, arthralgias and gait problem.  Skin: Negative for color change, pallor, rash and wound.  Neurological: Negative for headaches.  Hematological: Negative for adenopathy. Does not bruise/bleed easily.  Psychiatric/Behavioral: Negative.        Objective:   Physical Exam  Vitals reviewed. Constitutional: He is oriented to person, place, and time. He appears well-developed and well-nourished. No distress.  HENT:  Head: Normocephalic and atraumatic.  Right Ear: Hearing, tympanic membrane, external ear and ear canal normal.  Left Ear: Hearing, tympanic membrane, external ear and ear canal normal.  Nose: Mucosal edema and rhinorrhea present. No nose lacerations, sinus tenderness, nasal deformity, septal deviation or nasal septal hematoma. No epistaxis.  No foreign bodies. Right sinus exhibits no maxillary sinus tenderness and no frontal sinus tenderness. Left sinus exhibits no maxillary sinus tenderness.  Mouth/Throat: Oropharynx is clear and moist and mucous membranes are normal. Mucous membranes are not pale,  not dry and not cyanotic. No oropharyngeal exudate, posterior oropharyngeal edema, posterior oropharyngeal erythema or tonsillar abscesses.  Eyes: Conjunctivae are normal. Right eye exhibits no discharge.  Left eye exhibits no discharge. No scleral icterus.  Neck: Normal range of motion. Neck supple. No JVD present. No tracheal deviation present. No thyromegaly present.  Cardiovascular: Normal rate, regular rhythm, normal heart sounds and intact distal pulses.  Exam reveals no gallop and no friction rub.   No murmur heard. Pulmonary/Chest: Effort normal and breath sounds normal. No stridor. No respiratory distress. He has no wheezes. He has no rales. He exhibits no tenderness.  Abdominal: Soft. Normal appearance and bowel sounds are normal. He exhibits no shifting dullness, no distension, no pulsatile liver, no fluid wave, no abdominal bruit, no ascites, no pulsatile midline mass and no mass. There is no hepatosplenomegaly. There is tenderness in the right upper quadrant. There is no rigidity, no rebound, no guarding, no CVA tenderness, no tenderness at McBurney's point and negative Murphy's sign. No hernia. Hernia confirmed negative in the ventral area.  Genitourinary:       This exam was refused by him  Musculoskeletal: Normal range of motion. He exhibits no edema and no tenderness.  Lymphadenopathy:    He has no cervical adenopathy.  Neurological: He is oriented to person, place, and time.  Skin: Skin is warm and dry. No rash noted. He is not diaphoretic. No erythema. No pallor.  Psychiatric: He has a normal mood and affect. His behavior is normal. Judgment and thought content normal.     Lab Results  Component Value Date   WBC 7.1 09/27/2011   HGB 14.6 09/27/2011   HCT 43.8 09/27/2011   PLT 233.0 09/27/2011   GLUCOSE 97 09/27/2011   CHOL 174 09/27/2011   TRIG 289.0* 09/27/2011   HDL 37.50* 09/27/2011   LDLDIRECT 90.3 09/27/2011   ALT 32 09/27/2011   AST 21 09/27/2011   NA 141 09/27/2011   K 3.7 09/27/2011     CL 104 09/27/2011   CREATININE 1.3 09/27/2011   BUN 12 09/27/2011   CO2 27 09/27/2011   TSH 1.37 06/27/2011   PSA 4.76* 06/27/2011       Assessment & Plan:

## 2011-10-10 ENCOUNTER — Other Ambulatory Visit: Payer: Self-pay | Admitting: *Deleted

## 2011-10-10 DIAGNOSIS — R6881 Early satiety: Secondary | ICD-10-CM

## 2011-10-10 MED ORDER — ESOMEPRAZOLE MAGNESIUM 40 MG PO CPDR: 40.0000 mg | DELAYED_RELEASE_CAPSULE | Freq: Every day | ORAL | Status: DC

## 2011-10-10 NOTE — Telephone Encounter (Signed)
Done. Pt informed.

## 2011-10-11 ENCOUNTER — Ambulatory Visit: Payer: 59 | Admitting: Internal Medicine

## 2011-11-05 ENCOUNTER — Ambulatory Visit (INDEPENDENT_AMBULATORY_CARE_PROVIDER_SITE_OTHER): Payer: 59 | Admitting: Gastroenterology

## 2011-11-05 ENCOUNTER — Encounter: Payer: Self-pay | Admitting: Gastroenterology

## 2011-11-05 VITALS — BP 110/64 | HR 80 | Ht 72.0 in | Wt 202.0 lb

## 2011-11-05 DIAGNOSIS — R112 Nausea with vomiting, unspecified: Secondary | ICD-10-CM

## 2011-11-05 DIAGNOSIS — R634 Abnormal weight loss: Secondary | ICD-10-CM

## 2011-11-05 DIAGNOSIS — R1013 Epigastric pain: Secondary | ICD-10-CM

## 2011-11-05 MED ORDER — OMEPRAZOLE 20 MG PO CPDR: 20.0000 mg | DELAYED_RELEASE_CAPSULE | Freq: Every day | ORAL | Status: DC

## 2011-11-05 MED ORDER — ONDANSETRON HCL 4 MG PO TABS: 4.0000 mg | ORAL_TABLET | Freq: Three times a day (TID) | ORAL | Status: DC | PRN

## 2011-11-05 MED ORDER — ONDANSETRON HCL 4 MG PO TABS: 4.0000 mg | ORAL_TABLET | Freq: Three times a day (TID) | ORAL | Status: AC | PRN

## 2011-11-05 NOTE — Progress Notes (Signed)
Addended by: Harlow Mares D on: 11/05/2011 10:40 AM   Modules accepted: Orders

## 2011-11-05 NOTE — Progress Notes (Signed)
History of Present Illness:  This is a 51 year old African American male referred for evaluation of 2 months of epigastric abdominal pain with constant nausea and intermittent vomiting of partially digested food products. Patient was on diet and apparently felt that he had a" pulled muscle" and was taken a fair amount of NSAIDs. Subsequent to this had known epigastric discomfort which does awaken him from sleep, but has not been improved by 2 weeks of Nexium therapy. The patient does relate black tarry stools, but review of recent labs shows normal CBC a metabolic profile. Also CT scan of the abdomen was unremarkable. The patient denies acid reflux, dysphagia, history of hepatitis, pancreatitis, alcohol or cigarette abuse. In the last 2 months he has lost 35 pounds in weight, partially voluntarily. Patient not had previous endoscopy or colonoscopy. His father died of prostate cancer. The patient continues without any appetite without any specific food intolerances.  I have reviewed this patient's present history, medical and surgical past history, allergies and medications.     ROS: The remainder of the 10 point ROS is negative     Physical Exam: Healthy-appearing in no acute distress. Blood pressure 110/64, pulse 80 and regular, and weight 202 pounds. BMI is 27.40 General well developed well nourished patient in no acute distress, appearing their stated age Eyes PERRLA, no icterus, fundoscopic exam per opthamologist Skin no lesions noted Neck supple, no adenopathy, no thyroid enlargement, no tenderness Chest clear to percussion and auscultation Heart no significant murmurs, gallops or rubs noted Abdomen no hepatosplenomegaly masses or tenderness, BS normal. I cannot appreciate a succussion splash.  Rectal inspection normal no fissures, or fistulae noted.  No masses or tenderness on digital exam. Stool guaiac positive. His prostate is somewhat firm an enlarged left lobe greater than  right. Extremities no acute joint lesions, edema, phlebitis or evidence of cellulitis. Neurologic patient oriented x 3, cranial nerves intact, no focal neurologic deficits noted. Psychological mental status normal and normal affect.  Assessment and plan: Symptoms suggestive of partial gastric outlet obstruction possibly from NSAID ulceration, rule out H. pylori infection, carcinoma, or other structural abnormalities. I've scheduled endoscopy ASAP around his schedule. We have placed him on Prilosec 20 mg a day with when necessary Zofran 4 mg every 8 hours when necessary pending his endoscopic evaluation. At some point he also needs screening colonoscopy. Dr. Yetta Barre is following his enlarged prostate and increased PSA level. He may need urologic referral.  No diagnosis found.

## 2011-11-05 NOTE — Patient Instructions (Signed)
Your procedure has been scheduled for 11/08/2011, please follow the seperate instructions.  Your prescription(s) have been sent to you pharmacy, Zofran, Prilosec.  Take Prilosec once a day 30 min before breakfast Take Zofran as needed for nausea.

## 2011-11-08 ENCOUNTER — Other Ambulatory Visit: Payer: Self-pay | Admitting: Gastroenterology

## 2011-11-08 ENCOUNTER — Ambulatory Visit (AMBULATORY_SURGERY_CENTER): Payer: 59 | Admitting: Gastroenterology

## 2011-11-08 ENCOUNTER — Encounter: Payer: Self-pay | Admitting: Gastroenterology

## 2011-11-08 VITALS — BP 114/68 | HR 88 | Temp 99.1°F | Resp 16 | Ht 72.0 in | Wt 202.0 lb

## 2011-11-08 DIAGNOSIS — R634 Abnormal weight loss: Secondary | ICD-10-CM

## 2011-11-08 DIAGNOSIS — K259 Gastric ulcer, unspecified as acute or chronic, without hemorrhage or perforation: Secondary | ICD-10-CM

## 2011-11-08 DIAGNOSIS — R112 Nausea with vomiting, unspecified: Secondary | ICD-10-CM

## 2011-11-08 DIAGNOSIS — K279 Peptic ulcer, site unspecified, unspecified as acute or chronic, without hemorrhage or perforation: Secondary | ICD-10-CM

## 2011-11-08 DIAGNOSIS — R1013 Epigastric pain: Secondary | ICD-10-CM

## 2011-11-08 HISTORY — DX: Gastric ulcer, unspecified as acute or chronic, without hemorrhage or perforation: K25.9

## 2011-11-08 MED ORDER — OMEPRAZOLE 20 MG PO CPDR: 20.0000 mg | DELAYED_RELEASE_CAPSULE | Freq: Two times a day (BID) | ORAL | Status: DC

## 2011-11-08 MED ORDER — SODIUM CHLORIDE 0.9 % IV SOLN: 500.0000 mL | INTRAVENOUS | Status: DC

## 2011-11-08 NOTE — Op Note (Signed)
[facility] [address] [place], [place]  [address]  ENDOSCOPY PROCEDURE REPORT  PATIENT:  [name], Howard  MR#:  [id number] BIRTHDATE:  07/31/1959, 52 yrs. old  GENDER:  male  ENDOSCOPIST:  Vania Rea. Jarold Motto, MD, [facility] Referred by:  [name], M.D.  PROCEDURE DATE:  11/08/2011 PROCEDURE:  EGD with biopsy for H. pylori 30865 ASA CLASS:  Class II INDICATIONS:  nausea and vomiting  MEDICATIONS:   propofol (Diprivan) 120 mg IV TOPICAL ANESTHETIC:  DESCRIPTION OF PROCEDURE:   After the risks and benefits of the procedure were explained, informed consent was obtained.  The LB GIF-H180 [id number] endoscope was introduced through the mouth and advanced to the second portion of the duodenum.  The instrument was slowly withdrawn as the mucosa was fully examined. <<PROCEDUREIMAGES>>  Multiple ulcers were found in the body and the antrum of the stomach. large shallow ulceration extending into the pelorus with bleeding and pyloric deformity.CLO BX. DONE.  Normal duodenal folds were noted.  The esophagus and gastroesophageal junction were completely normal in appearance.    Retroflexed views revealed no abnormalities.    The scope was then withdrawn from the patient and the procedure completed.  COMPLICATIONS:  None  ENDOSCOPIC IMPRESSION: 1) Ulcers, multiple in the body and the antrum of the stomach 2) Normal duodenal folds 3) Normal esophagus PROBABLE NSAID ULCERS,R/O H.PYLORI. RECOMMENDATIONS: 1) Avoid NSAIDS for two weeks NEXIUM 40G/DAY  ______________________________ Vania Rea. Jarold Motto, MD, [facility]  CC:  n. eSIGNED:   Vania Rea. Wynelle Dreier at 11/08/2011 01:55 PM  Miguel Aschoff, [phone]

## 2011-11-08 NOTE — Patient Instructions (Signed)
Avoid NSAIDS for two weeks, until Friday, Nov 22, 2011.  You may resume your other prior medications today.  Please call if you have any problems or questions.    YOU HAD AN ENDOSCOPIC PROCEDURE TODAY AT THE [facility]: Refer to the procedure report that was given to you for any specific questions about what was found during the examination.  If the procedure report does not answer your questions, please call your gastroenterologist to clarify.  If you requested that your care partner not be given the details of your procedure findings, then the procedure report has been included in a sealed envelope for you to review at your convenience later.  YOU SHOULD EXPECT: Some feelings of bloating in the abdomen. Passage of more gas than usual.  Walking can help get rid of the air that was put into your GI tract during the procedure and reduce the bloating. If you had a lower endoscopy (such as a colonoscopy or flexible sigmoidoscopy) you may notice spotting of blood in your stool or on the toilet paper. If you underwent a bowel prep for your procedure, then you may not have a normal bowel movement for a few days.  DIET: Your first meal following the procedure should be a light meal and then it is ok to progress to your normal diet.  A half-sandwich or bowl of soup is an example of a good first meal.  Heavy or fried foods are harder to digest and may make you feel nauseous or bloated.  Likewise meals heavy in dairy and vegetables can cause extra gas to form and this can also increase the bloating.  Drink plenty of fluids but you should avoid alcoholic beverages for 24 hours.  ACTIVITY: Your care partner should take you home directly after the procedure.  You should plan to take it easy, moving slowly for the rest of the day.  You can resume normal activity the day after the procedure however you should NOT DRIVE or use heavy machinery for 24 hours (because of the sedation medicines used during the  test).    SYMPTOMS TO REPORT IMMEDIATELY: A gastroenterologist can be reached at any hour.  During normal business hours, 8:30 AM to 5:00 PM Monday through Friday, call [phone].  After hours and on weekends, please call the GI answering service at [phone] who will take a message and have the physician on call contact you.     Following upper endoscopy (EGD)  Vomiting of blood or coffee ground material  New chest pain or pain under the shoulder blades  Painful or persistently difficult swallowing  New shortness of breath  Fever of 100F or higher  Black, tarry-looking stools  FOLLOW UP: If any biopsies were taken you will be contacted by phone or by letter within the next 1-3 weeks.  Call your gastroenterologist if you have not heard about the biopsies in 3 weeks.  Our staff will call the home number listed on your records the next business day following your procedure to check on you and address any questions or concerns that you may have at that time regarding the information given to you following your procedure. This is a courtesy call and so if there is no answer at the home number and we have not heard from you through the emergency physician on call, we will assume that you have returned to your regular daily activities without incident.  SIGNATURES/CONFIDENTIALITY: You and/or your care partner have signed paperwork which  will be entered into your electronic medical record.  These signatures attest to the fact that that the information above on your After Visit Summary has been reviewed and is understood.  Full responsibility of the confidentiality of this discharge information lies with you and/or your care-partner.

## 2011-11-08 NOTE — Progress Notes (Signed)
No complaints noted in the recovery room. Maw  Patient did not experience any of the following events: a burn prior to discharge; a fall within the facility; wrong site/side/patient/procedure/implant event; or a hospital transfer or hospital admission upon discharge from the facility. (G8907) Patient did not have preoperative order for IV antibiotic SSI prophylaxis. (G8918)  

## 2011-11-11 ENCOUNTER — Encounter: Payer: Self-pay | Admitting: Gastroenterology

## 2011-11-11 ENCOUNTER — Telehealth: Payer: Self-pay

## 2011-11-11 LAB — HELICOBACTER PYLORI SCREEN-BIOPSY: UREASE: NEGATIVE

## 2011-11-11 NOTE — Telephone Encounter (Signed)
  Follow up Call-  Call back number 11/08/2011  Post procedure Call Back phone  # [phone]  Permission to leave phone message Yes     Patient questions:  Do you have a fever, pain , or abdominal swelling? no Pain Score  0 *  Have you tolerated food without any problems? yes  Have you been able to return to your normal activities? yes  Do you have any questions about your discharge instructions: Diet   no Medications  no Follow up visit  no  Do you have questions or concerns about your Care? no  Actions: * If pain score is 4 or above: No action needed, pain <4.

## 2011-11-15 ENCOUNTER — Ambulatory Visit: Payer: 59 | Admitting: Gastroenterology

## 2011-11-22 ENCOUNTER — Telehealth: Payer: Self-pay | Admitting: Gastroenterology

## 2011-11-22 MED ORDER — ONDANSETRON HCL 4 MG PO TABS: ORAL_TABLET | ORAL | Status: DC

## 2011-11-22 NOTE — Telephone Encounter (Signed)
Notified pt I ordered his zofran.

## 2011-12-04 ENCOUNTER — Encounter: Payer: Self-pay | Admitting: *Deleted

## 2011-12-12 ENCOUNTER — Encounter: Payer: Self-pay | Admitting: Gastroenterology

## 2011-12-12 ENCOUNTER — Other Ambulatory Visit: Payer: Self-pay | Admitting: Internal Medicine

## 2011-12-12 ENCOUNTER — Ambulatory Visit (INDEPENDENT_AMBULATORY_CARE_PROVIDER_SITE_OTHER): Payer: 59 | Admitting: Gastroenterology

## 2011-12-12 VITALS — BP 146/92 | HR 80 | Ht 72.0 in | Wt 199.5 lb

## 2011-12-12 DIAGNOSIS — I10 Essential (primary) hypertension: Secondary | ICD-10-CM

## 2011-12-12 DIAGNOSIS — Z8719 Personal history of other diseases of the digestive system: Secondary | ICD-10-CM

## 2011-12-12 DIAGNOSIS — Z800 Family history of malignant neoplasm of digestive organs: Secondary | ICD-10-CM

## 2011-12-12 MED ORDER — OMEPRAZOLE 20 MG PO CPDR: 20.0000 mg | DELAYED_RELEASE_CAPSULE | Freq: Two times a day (BID) | ORAL | Status: DC

## 2011-12-12 NOTE — Progress Notes (Signed)
This is a 51 year old African American male he recently had endoscopy because of abdominal pain and nausea and vomiting. There multiple gastric ulcerations related to NSAID use with a negative biopsy for H. pylori. He is currently on Prilosec 20 mg twice a day and is 75% better. Over the course of his illness he has lost approximately 30 pounds. His weight is now stable. He denies any specific food intolerances, and has no further nausea and vomiting or hepatobiliary complaints. Previous imaging of his upper abdomen showed no evidence of cholelithiasis or other intra-abdominal abnormalities. He is on Benicar HCTZ for hypertension. He denies a current cardiovascular pulmonary complaints.  Current Medications, Allergies, Past Medical History, Past Surgical History, Family History and Social History were reviewed in [employer] record.  Pertinent Review of Systems Negative   Physical Exam: Healthy patient in no distress. Blood pressure 146/92, pulse 80 and regular, and weight under 99 pounds with BMI of 27.06. I cannot appreciate stigmata of chronic liver disease. Chest is clear he is in a regular rhythm without murmurs gallops or rubs. There is no organomegaly, abdominal masses, or tenderness. There is no succussion splash. Peripheral extremities are unremarkable mental status is normal.    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 We will continue his current medications with office followup in 2 months time. The patient has a family history of colon cancer in his father, and needs to be scheduled for screening colonoscopy. He otherwise is to continue his medications as listed and reviewed per primary care. No diagnosis found.

## 2011-12-12 NOTE — Patient Instructions (Addendum)
You have been given a separate informational sheet regarding your tobacco use, the importance of quitting and local resources to help you quit. Your prescription for Prilosec has been refilled for 1 year. Please follow up with Dr Jarold Motto in 2 months.

## 2012-02-06 ENCOUNTER — Encounter: Payer: Self-pay | Admitting: *Deleted

## 2012-02-13 ENCOUNTER — Ambulatory Visit: Payer: 59 | Admitting: Gastroenterology

## 2012-02-13 ENCOUNTER — Other Ambulatory Visit: Payer: Self-pay | Admitting: Internal Medicine

## 2012-04-16 ENCOUNTER — Other Ambulatory Visit: Payer: Self-pay | Admitting: Gastroenterology

## 2012-11-18 ENCOUNTER — Encounter: Payer: Self-pay | Admitting: Internal Medicine

## 2012-11-18 ENCOUNTER — Other Ambulatory Visit: Payer: Self-pay | Admitting: Internal Medicine

## 2012-11-18 ENCOUNTER — Other Ambulatory Visit (INDEPENDENT_AMBULATORY_CARE_PROVIDER_SITE_OTHER): Payer: 59

## 2012-11-18 ENCOUNTER — Ambulatory Visit (INDEPENDENT_AMBULATORY_CARE_PROVIDER_SITE_OTHER): Payer: 59 | Admitting: Internal Medicine

## 2012-11-18 VITALS — BP 98/62 | HR 92 | Temp 98.9°F | Resp 16 | Wt 186.0 lb

## 2012-11-18 DIAGNOSIS — I10 Essential (primary) hypertension: Secondary | ICD-10-CM

## 2012-11-18 DIAGNOSIS — IMO0001 Reserved for inherently not codable concepts without codable children: Secondary | ICD-10-CM

## 2012-11-18 DIAGNOSIS — K529 Noninfective gastroenteritis and colitis, unspecified: Secondary | ICD-10-CM | POA: Insufficient documentation

## 2012-11-18 DIAGNOSIS — K5289 Other specified noninfective gastroenteritis and colitis: Secondary | ICD-10-CM

## 2012-11-18 DIAGNOSIS — N400 Enlarged prostate without lower urinary tract symptoms: Secondary | ICD-10-CM

## 2012-11-18 LAB — CBC WITH DIFFERENTIAL/PLATELET
Basophils Relative: 0.3 % (ref 0.0–3.0)
Eosinophils Absolute: 0.4 10*3/uL (ref 0.0–0.7)
Eosinophils Relative: 2.3 % (ref 0.0–5.0)
HCT: 40.6 % (ref 39.0–52.0)
Lymphs Abs: 1.1 10*3/uL (ref 0.7–4.0)
MCHC: 33.9 g/dL (ref 30.0–36.0)
MCV: 82.2 fl (ref 78.0–100.0)
Monocytes Absolute: 0.8 10*3/uL (ref 0.1–1.0)
Platelets: 378 10*3/uL (ref 150.0–400.0)
RBC: 4.94 Mil/uL (ref 4.22–5.81)
WBC: 17.1 10*3/uL — ABNORMAL HIGH (ref 4.5–10.5)

## 2012-11-18 LAB — COMPREHENSIVE METABOLIC PANEL
AST: 23 U/L (ref 0–37)
Albumin: 3.1 g/dL — ABNORMAL LOW (ref 3.5–5.2)
Alkaline Phosphatase: 88 U/L (ref 39–117)
Glucose, Bld: 89 mg/dL (ref 70–99)
Potassium: 4 mEq/L (ref 3.5–5.1)
Sodium: 138 mEq/L (ref 135–145)
Total Bilirubin: 0.8 mg/dL (ref 0.3–1.2)
Total Protein: 6.4 g/dL (ref 6.0–8.3)

## 2012-11-18 MED ORDER — METHYLPREDNISOLONE ACETATE 80 MG/ML IJ SUSP
80.0000 mg | Freq: Once | INTRAMUSCULAR | Status: AC
  Administered 2012-11-18: 80 mg via INTRAMUSCULAR

## 2012-11-18 MED ORDER — METHYLPREDNISOLONE ACETATE 40 MG/ML IJ SUSP
40.0000 mg | Freq: Once | INTRAMUSCULAR | Status: AC
  Administered 2012-11-18: 40 mg via INTRAMUSCULAR

## 2012-11-18 MED ORDER — CIPROFLOXACIN HCL 500 MG PO TABS: 500.0000 mg | ORAL_TABLET | Freq: Two times a day (BID) | ORAL | Status: DC

## 2012-11-18 NOTE — Patient Instructions (Signed)
Viral Gastroenteritis Viral gastroenteritis is also known as stomach flu. This condition affects the stomach and intestinal tract. It can cause sudden diarrhea and vomiting. The illness typically lasts 3 to 8 days. Most people develop an immune response that eventually gets rid of the virus. While this natural response develops, the virus can make you quite ill. CAUSES  Many different viruses can cause gastroenteritis, such as rotavirus or noroviruses. You can catch one of these viruses by consuming contaminated food or water. You may also catch a virus by sharing utensils or other personal items with an infected person or by touching a contaminated surface. SYMPTOMS  The most common symptoms are diarrhea and vomiting. These problems can cause a severe loss of body fluids (dehydration) and a body salt (electrolyte) imbalance. Other symptoms may include:  Fever.  Headache.  Fatigue.  Abdominal pain. DIAGNOSIS  Your caregiver can usually diagnose viral gastroenteritis based on your symptoms and a physical exam. A stool sample may also be taken to test for the presence of viruses or other infections. TREATMENT  This illness typically goes away on its own. Treatments are aimed at rehydration. The most serious cases of viral gastroenteritis involve vomiting so severely that you are not able to keep fluids down. In these cases, fluids must be given through an intravenous line (IV). HOME CARE INSTRUCTIONS   Drink enough fluids to keep your urine clear or pale yellow. Drink small amounts of fluids frequently and increase the amounts as tolerated.  Ask your caregiver for specific rehydration instructions.  Avoid:  Foods high in sugar.  Alcohol.  Carbonated drinks.  Tobacco.  Juice.  Caffeine drinks.  Extremely hot or cold fluids.  Fatty, greasy foods.  Too much intake of anything at one time.  Dairy products until 24 to 48 hours after diarrhea stops.  You may consume probiotics.  Probiotics are active cultures of beneficial bacteria. They may lessen the amount and number of diarrheal stools in adults. Probiotics can be found in yogurt with active cultures and in supplements.  Wash your hands well to avoid spreading the virus.  Only take over-the-counter or prescription medicines for pain, discomfort, or fever as directed by your caregiver. Do not give aspirin to children. Antidiarrheal medicines are not recommended.  Ask your caregiver if you should continue to take your regular prescribed and over-the-counter medicines.  Keep all follow-up appointments as directed by your caregiver. SEEK IMMEDIATE MEDICAL CARE IF:   You are unable to keep fluids down.  You do not urinate at least once every 6 to 8 hours.  You develop shortness of breath.  You notice blood in your stool or vomit. This may look like coffee grounds.  You have abdominal pain that increases or is concentrated in one small area (localized).  You have persistent vomiting or diarrhea.  You have a fever.  The patient is a child younger than 3 months, and he or she has a fever.  The patient is a child older than 3 months, and he or she has a fever and persistent symptoms.  The patient is a child older than 3 months, and he or she has a fever and symptoms suddenly get worse.  The patient is a baby, and he or she has no tears when crying. MAKE SURE YOU:   Understand these instructions.  Will watch your condition.  Will get help right away if you are not doing well or get worse. Document Released: 06/10/2005 Document Revised: 09/02/2011 Document Reviewed: 03/27/2011   ExitCare Patient Information 2014 [employer].  

## 2012-11-18 NOTE — Assessment & Plan Note (Signed)
His BP is low and he feels lightheaded so I have asked him to stop the Benicar-HCT I will check his lytes and renal function today

## 2012-11-18 NOTE — Assessment & Plan Note (Signed)
I will recheck his PSA today 

## 2012-11-18 NOTE — Assessment & Plan Note (Signed)
It appears that the infection has resolved I will check his labs today to see if he has developed any complications

## 2012-11-18 NOTE — Progress Notes (Signed)
  Subjective:    Patient ID: Howard [name], male    DOB: 07/31/1959, 52 y.o.   MRN: [id number]  HPI  He developed watery diarrhea with N/V 10 days ago. Those symptoms have resolved but now he complains of diffuse weakness, myalgias, and fatigue.  Review of Systems  Constitutional: Positive for chills and fatigue. Negative for fever, diaphoresis, activity change, appetite change and unexpected weight change.  HENT: Negative.   Eyes: Negative.   Respiratory: Negative.  Negative for cough, chest tightness, shortness of breath, wheezing and stridor.   Cardiovascular: Negative.  Negative for chest pain, palpitations and leg swelling.  Gastrointestinal: Negative for nausea, vomiting, abdominal pain, diarrhea, constipation and blood in stool.  Endocrine: Negative.   Genitourinary: Negative.   Musculoskeletal: Positive for myalgias. Negative for back pain, arthralgias and gait problem.  Skin: Negative.  Negative for color change, pallor, rash and wound.  Allergic/Immunologic: Negative.   Neurological: Positive for weakness and light-headedness. Negative for dizziness, tremors, syncope, facial asymmetry, speech difficulty, numbness and headaches.  Hematological: Negative.  Negative for adenopathy. Does not bruise/bleed easily.  Psychiatric/Behavioral: Negative.        Objective:   Physical Exam  Vitals reviewed. Constitutional: He is oriented to person, place, and time. Vital signs are normal. He appears well-developed and well-nourished.  Non-toxic appearance. He does not have a sickly appearance. He does not appear ill. He appears distressed (he moves slowly and whinces in pain with movement).  HENT:  Head: Normocephalic and atraumatic.  Mouth/Throat: Oropharynx is clear and moist. No oropharyngeal exudate.  Eyes: Conjunctivae are normal. Right eye exhibits no discharge. Left eye exhibits no discharge. No scleral icterus.  Neck: Normal range of motion. Neck supple. No JVD present. No  tracheal deviation present. No thyromegaly present.  Cardiovascular: Normal rate, regular rhythm, normal heart sounds and intact distal pulses.  Exam reveals no gallop and no friction rub.   No murmur heard. Pulmonary/Chest: Effort normal and breath sounds normal. No stridor. No respiratory distress. He has no wheezes. He has no rales. He exhibits no tenderness.  Abdominal: Soft. Bowel sounds are normal. He exhibits no distension and no mass. There is no tenderness. There is no rebound and no guarding.  Musculoskeletal: Normal range of motion. He exhibits no edema.  Lymphadenopathy:    He has no cervical adenopathy.  Neurological: He is oriented to person, place, and time. He has normal reflexes. He displays normal reflexes. He exhibits normal muscle tone. Coordination normal.  Skin: Skin is warm and dry. No rash noted. He is not diaphoretic. No erythema. No pallor.  Psychiatric: He has a normal mood and affect. His behavior is normal. Judgment and thought content normal.      Lab Results  Component Value Date   WBC 6.0 10/07/2011   HGB 15.0 10/07/2011   HCT 44.8 10/07/2011   PLT 217.0 10/07/2011   GLUCOSE 95 10/07/2011   CHOL 174 09/27/2011   TRIG 289.0* 09/27/2011   HDL 37.50* 09/27/2011   LDLDIRECT 90.3 09/27/2011   ALT 30 10/07/2011   AST 25 10/07/2011   NA 140 10/07/2011   K 4.0 10/07/2011   CL 103 10/07/2011   CREATININE 1.4 10/07/2011   BUN 13 10/07/2011   CO2 26 10/07/2011   TSH 1.37 06/27/2011   PSA 4.76* 06/27/2011      Assessment & Plan:

## 2012-11-18 NOTE — Assessment & Plan Note (Signed)
He can't take nsaids due to a history of PUD so I gave him an injection of depo-medrol IM for symptom relief I will check his labs today to look for myopathy, rhabdo, inflammation, abnormal lytes, renal failure, etc.

## 2012-11-23 ENCOUNTER — Telehealth: Payer: Self-pay | Admitting: Internal Medicine

## 2012-11-23 MED ORDER — TRAMADOL HCL 50 MG PO TABS: 50.0000 mg | ORAL_TABLET | Freq: Three times a day (TID) | ORAL | Status: DC | PRN

## 2012-11-23 NOTE — Telephone Encounter (Signed)
I need a pharmacy

## 2012-11-23 NOTE — Telephone Encounter (Signed)
Please advise on continued pain for pt, OTC medication not working and pt out of town. Thanks

## 2012-11-23 NOTE — Telephone Encounter (Signed)
Pharmacy has been updated in Epic, [facility] [address] Thanks

## 2012-11-23 NOTE — Telephone Encounter (Signed)
The patient called stating he was still having severe joint pain.  He found a over the counter joint medicine called 'arthriten'.  He stated he was out of town till Thursday, so he did make an appointment to follow up with Dr.Jones on Friday. Patient's call back - #[phone]

## 2012-11-23 NOTE — Telephone Encounter (Signed)
done

## 2012-11-27 ENCOUNTER — Telehealth: Payer: Self-pay | Admitting: Internal Medicine

## 2012-11-27 ENCOUNTER — Ambulatory Visit (INDEPENDENT_AMBULATORY_CARE_PROVIDER_SITE_OTHER): Payer: 59 | Admitting: Internal Medicine

## 2012-11-27 ENCOUNTER — Other Ambulatory Visit (INDEPENDENT_AMBULATORY_CARE_PROVIDER_SITE_OTHER): Payer: 59

## 2012-11-27 ENCOUNTER — Encounter: Payer: Self-pay | Admitting: Internal Medicine

## 2012-11-27 VITALS — BP 110/68 | HR 99 | Temp 98.7°F | Resp 16 | Wt 193.0 lb

## 2012-11-27 DIAGNOSIS — I10 Essential (primary) hypertension: Secondary | ICD-10-CM

## 2012-11-27 DIAGNOSIS — M2550 Pain in unspecified joint: Secondary | ICD-10-CM

## 2012-11-27 LAB — RHEUMATOID FACTOR: Rhuematoid fact SerPl-aCnc: <10 IU/mL (ref <=14)

## 2012-11-27 LAB — CBC WITH DIFFERENTIAL/PLATELET
Basophils Absolute: 0.1 10*3/uL (ref 0.0–0.1)
Hemoglobin: 12.1 g/dL — ABNORMAL LOW (ref 13.0–17.0)
Lymphocytes Relative: 10.2 % — ABNORMAL LOW (ref 12.0–46.0)
Monocytes Relative: 7.8 % (ref 3.0–12.0)
Neutro Abs: 8.5 10*3/uL — ABNORMAL HIGH (ref 1.4–7.7)
Neutrophils Relative %: 79.3 % — ABNORMAL HIGH (ref 43.0–77.0)
Platelets: 414 10*3/uL — ABNORMAL HIGH (ref 150.0–400.0)
RDW: 14.9 % — ABNORMAL HIGH (ref 11.5–14.6)

## 2012-11-27 LAB — CK: Total CK: 51 U/L (ref 7–232)

## 2012-11-27 LAB — SEDIMENTATION RATE: Sed Rate: 38 mm/hr — ABNORMAL HIGH (ref 0–22)

## 2012-11-27 MED ORDER — DICLOFENAC 35 MG PO CAPS: 1.0000 | ORAL_CAPSULE | Freq: Three times a day (TID) | ORAL | Status: DC | PRN

## 2012-11-27 NOTE — Telephone Encounter (Signed)
RX resent

## 2012-11-27 NOTE — Progress Notes (Signed)
Subjective:    Patient ID: Howard [name], male    DOB: 07/31/1959, 52 y.o.   MRN: [id number]  Arthritis Presents for follow-up visit. He complains of pain and stiffness. He reports no joint swelling or joint warmth. Affected locations include the right shoulder, left shoulder, right hip, left hip, left PIP, right PIP, right MCP, right DIP, left MCP and left DIP. His pain is at a severity of 4/10. Associated symptoms include fatigue, pain at night and pain while resting. Pertinent negatives include no diarrhea, dry eyes, dry mouth, dysuria, fever, rash, Raynaud's syndrome, uveitis or weight loss. Compliance with total regimen is 26-50%.      Review of Systems  Constitutional: Positive for fatigue. Negative for fever, chills, weight loss, diaphoresis, activity change, appetite change and unexpected weight change.  HENT: Negative.   Eyes: Negative.   Respiratory: Negative.  Negative for cough, chest tightness, shortness of breath, wheezing and stridor.   Cardiovascular: Negative.  Negative for chest pain, palpitations and leg swelling.  Gastrointestinal: Negative for nausea, vomiting, abdominal pain, diarrhea, constipation, abdominal distention, anal bleeding and rectal pain.  Endocrine: Negative.   Genitourinary: Negative.  Negative for dysuria.  Musculoskeletal: Positive for arthralgias, arthritis and stiffness. Negative for myalgias, back pain, joint swelling and gait problem.  Skin: Negative.  Negative for color change, pallor, rash and wound.  Allergic/Immunologic: Negative.   Neurological: Negative.   Hematological: Negative.  Negative for adenopathy. Does not bruise/bleed easily.  Psychiatric/Behavioral: Negative.        Objective:   Physical Exam  Vitals reviewed. Constitutional: He is oriented to person, place, and time. He appears well-developed and well-nourished.  Non-toxic appearance. He does not have a sickly appearance. He does not appear ill. No distress.  HENT:  Head:  Normocephalic and atraumatic.  Mouth/Throat: Oropharynx is clear and moist. No oropharyngeal exudate.  Eyes: Conjunctivae are normal. Right eye exhibits no discharge. Left eye exhibits no discharge. No scleral icterus.  Neck: Normal range of motion. Neck supple. No JVD present. No tracheal deviation present. No thyromegaly present.  Cardiovascular: Normal rate, regular rhythm, normal heart sounds and intact distal pulses.  Exam reveals no gallop and no friction rub.   No murmur heard. Pulmonary/Chest: Effort normal and breath sounds normal. No stridor. No respiratory distress. He has no wheezes. He has no rales. He exhibits no tenderness.  Abdominal: Soft. Bowel sounds are normal. He exhibits no distension and no mass. There is no tenderness. There is no rebound and no guarding.  Musculoskeletal: Normal range of motion. He exhibits no edema and no tenderness.       Right wrist: Normal. He exhibits normal range of motion, no tenderness, no bony tenderness, no swelling, no effusion, no crepitus, no deformity and no laceration.       Left wrist: Normal. He exhibits normal range of motion, no tenderness, no bony tenderness, no swelling, no effusion, no crepitus, no deformity and no laceration.       Right hip: Normal.       Left hip: Normal.       Right knee: Normal. He exhibits no swelling, no effusion and no erythema. No tenderness found.       Left knee: He exhibits no swelling, no effusion and no erythema. No tenderness found.       Right hand: Normal. He exhibits no tenderness, no bony tenderness and no swelling.       Left hand: He exhibits no tenderness, no bony tenderness and no swelling.  Lymphadenopathy:    He has no cervical adenopathy.  Neurological: He is oriented to person, place, and time.  Skin: Skin is warm and dry. No rash noted. He is not diaphoretic. No erythema. No pallor.  Psychiatric: He has a normal mood and affect. His behavior is normal. Judgment and thought content normal.      Lab Results  Component Value Date   WBC 17.1* 11/18/2012   HGB 13.8 11/18/2012   HCT 40.6 11/18/2012   PLT 378.0 11/18/2012   GLUCOSE 89 11/18/2012   CHOL 174 09/27/2011   TRIG 289.0* 09/27/2011   HDL 37.50* 09/27/2011   LDLDIRECT 90.3 09/27/2011   ALT 40 11/18/2012   AST 23 11/18/2012   NA 138 11/18/2012   K 4.0 11/18/2012   CL 101 11/18/2012   CREATININE 1.2 11/18/2012   BUN 14 11/18/2012   CO2 25 11/18/2012   TSH 1.63 11/18/2012   PSA 5.03* 11/18/2012       Assessment & Plan:

## 2012-11-27 NOTE — Telephone Encounter (Signed)
Pt called stated that Zorvolex need to be resend to [facility] on [address]. Please resend.

## 2012-11-27 NOTE — Assessment & Plan Note (Signed)
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

## 2012-11-27 NOTE — Patient Instructions (Signed)
Arthralgia Your caregiver has diagnosed you as suffering from an arthralgia. Arthralgia means there is pain in a joint. This can come from many reasons including:  Bruising the joint which causes soreness (inflammation) in the joint.  Wear and tear on the joints which occur as we grow older (osteoarthritis).  Overusing the joint.  Various forms of arthritis.  Infections of the joint. Regardless of the cause of pain in your joint, most of these different pains respond to anti-inflammatory drugs and rest. The exception to this is when a joint is infected, and these cases are treated with antibiotics, if it is a bacterial infection. HOME CARE INSTRUCTIONS   Rest the injured area for as long as directed by your caregiver. Then slowly start using the joint as directed by your caregiver and as the pain allows. Crutches as directed may be useful if the ankles, knees or hips are involved. If the knee was splinted or casted, continue use and care as directed. If an stretchy or elastic wrapping bandage has been applied today, it should be removed and re-applied every 3 to 4 hours. It should not be applied tightly, but firmly enough to keep swelling down. Watch toes and feet for swelling, bluish discoloration, coldness, numbness or excessive pain. If any of these problems (symptoms) occur, remove the ace bandage and re-apply more loosely. If these symptoms persist, contact your caregiver or return to this location.  For the first 24 hours, keep the injured extremity elevated on pillows while lying down.  Apply ice for 15-20 minutes to the sore joint every couple hours while awake for the first half day. Then 3-4 times per day for the first 48 hours. Put the ice in a plastic bag and place a towel between the bag of ice and your skin.  Wear any splinting, casting, elastic bandage applications, or slings as instructed.  Only take over-the-counter or prescription medicines for pain, discomfort, or fever as  directed by your caregiver. Do not use aspirin immediately after the injury unless instructed by your physician. Aspirin can cause increased bleeding and bruising of the tissues.  If you were given crutches, continue to use them as instructed and do not resume weight bearing on the sore joint until instructed. Persistent pain and inability to use the sore joint as directed for more than 2 to 3 days are warning signs indicating that you should see a caregiver for a follow-up visit as soon as possible. Initially, a hairline fracture (break in bone) may not be evident on X-rays. Persistent pain and swelling indicate that further evaluation, non-weight bearing or use of the joint (use of crutches or slings as instructed), or further X-rays are indicated. X-rays may sometimes not show a small fracture until a week or 10 days later. Make a follow-up appointment with your own caregiver or one to whom we have referred you. A radiologist (specialist in reading X-rays) may read your X-rays. Make sure you know how you are to obtain your X-ray results. Do not assume everything is normal if you do not hear from us. SEEK MEDICAL CARE IF: Bruising, swelling, or pain increases. SEEK IMMEDIATE MEDICAL CARE IF:   Your fingers or toes are numb or blue.  The pain is not responding to medications and continues to stay the same or get worse.  The pain in your joint becomes severe.  You develop a fever over 102 F (38.9 C).  It becomes impossible to move or use the joint. MAKE SURE YOU:     Understand these instructions.  Will watch your condition.  Will get help right away if you are not doing well or get worse. Document Released: 06/10/2005 Document Revised: 09/02/2011 Document Reviewed: 01/27/2008 [employer] Patient Information 2014 [employer].  

## 2012-11-27 NOTE — Assessment & Plan Note (Signed)
His BP is well controlled 

## 2012-11-30 ENCOUNTER — Encounter: Payer: Self-pay | Admitting: Internal Medicine

## 2012-11-30 LAB — ANA: Anti Nuclear Antibody(ANA): NEGATIVE

## 2012-12-03 ENCOUNTER — Encounter: Payer: Self-pay | Admitting: Internal Medicine

## 2012-12-03 ENCOUNTER — Ambulatory Visit (INDEPENDENT_AMBULATORY_CARE_PROVIDER_SITE_OTHER): Payer: 59 | Admitting: Internal Medicine

## 2012-12-03 ENCOUNTER — Other Ambulatory Visit (INDEPENDENT_AMBULATORY_CARE_PROVIDER_SITE_OTHER): Payer: 59

## 2012-12-03 VITALS — BP 120/84 | HR 91 | Temp 98.3°F | Resp 16 | Ht 72.0 in | Wt 194.5 lb

## 2012-12-03 DIAGNOSIS — D649 Anemia, unspecified: Secondary | ICD-10-CM

## 2012-12-03 DIAGNOSIS — M2550 Pain in unspecified joint: Secondary | ICD-10-CM

## 2012-12-03 DIAGNOSIS — K279 Peptic ulcer, site unspecified, unspecified as acute or chronic, without hemorrhage or perforation: Secondary | ICD-10-CM

## 2012-12-03 LAB — CBC WITH DIFFERENTIAL/PLATELET
Basophils Absolute: 0 10*3/uL (ref 0.0–0.1)
HCT: 35.5 % — ABNORMAL LOW (ref 39.0–52.0)
Lymphs Abs: 0.9 10*3/uL (ref 0.7–4.0)
Monocytes Relative: 9.4 % (ref 3.0–12.0)
Platelets: 319 10*3/uL (ref 150.0–400.0)
RDW: 15 % — ABNORMAL HIGH (ref 11.5–14.6)

## 2012-12-03 LAB — VITAMIN B12: Vitamin B-12: 259 pg/mL (ref 211–911)

## 2012-12-03 LAB — RETICULOCYTES
ABS Retic: 67 10*3/uL (ref 19.0–186.0)
RBC.: 4.19 MIL/uL — ABNORMAL LOW (ref 4.22–5.81)

## 2012-12-03 LAB — IBC PANEL: Iron: 36 ug/dL — ABNORMAL LOW (ref 42–165)

## 2012-12-03 LAB — SEDIMENTATION RATE: Sed Rate: 43 mm/hr — ABNORMAL HIGH (ref 0–22)

## 2012-12-03 MED ORDER — METHYLPREDNISOLONE 4 MG PO KIT: PACK | ORAL | Status: DC

## 2012-12-03 MED ORDER — FERRALET 90 90-1 MG PO TABS: 1.0000 | ORAL_TABLET | Freq: Every day | ORAL | Status: DC

## 2012-12-03 MED ORDER — ESOMEPRAZOLE MAGNESIUM 40 MG PO CPDR: 40.0000 mg | DELAYED_RELEASE_CAPSULE | Freq: Every day | ORAL | Status: DC

## 2012-12-03 MED ORDER — HYDROCODONE-ACETAMINOPHEN 10-325 MG PO TABS: 1.0000 | ORAL_TABLET | Freq: Three times a day (TID) | ORAL | Status: DC | PRN

## 2012-12-03 NOTE — Assessment & Plan Note (Signed)
I am concerned that he may be developing UGI disease so I have asked him to stop the nsaids Will control the pain with a medrol dose pak and norco

## 2012-12-03 NOTE — Assessment & Plan Note (Signed)
CBC shows a slightly worsening anemia with slightly low iron and B12 so I have asked him to start ferralet

## 2012-12-03 NOTE — Progress Notes (Signed)
  Subjective:    Patient ID: Howard [name], male    DOB: 07/31/1959, 52 y.o.   MRN: [id number]  Anemia Presents for initial visit. There has been no abdominal pain, anorexia, bruising/bleeding easily, confusion, fever, leg swelling, light-headedness, malaise/fatigue, pallor, palpitations, paresthesias, pica or weight loss. Signs of blood loss that are not present include hematemesis, hematochezia, melena, menorrhagia and vaginal bleeding. Past treatments include nothing.      Review of Systems  Constitutional: Positive for fatigue. Negative for fever, chills, weight loss, malaise/fatigue, diaphoresis, activity change, appetite change and unexpected weight change.  HENT: Negative.   Eyes: Negative.   Respiratory: Negative.  Negative for cough, chest tightness, shortness of breath, wheezing and stridor.   Cardiovascular: Negative.  Negative for chest pain, palpitations and leg swelling.  Gastrointestinal: Negative for nausea, vomiting, abdominal pain, diarrhea, constipation, blood in stool, melena, hematochezia, abdominal distention, anal bleeding, rectal pain, anorexia and hematemesis.  Endocrine: Negative.   Genitourinary: Negative.  Negative for vaginal bleeding and menorrhagia.  Musculoskeletal: Positive for arthralgias. Negative for myalgias, back pain, joint swelling and gait problem.  Skin: Negative.  Negative for pallor.  Allergic/Immunologic: Negative.   Neurological: Negative.  Negative for dizziness, tremors, syncope, weakness, light-headedness, numbness, headaches and paresthesias.  Hematological: Negative.  Negative for adenopathy. Does not bruise/bleed easily.  Psychiatric/Behavioral: Negative.  Negative for confusion.       Objective:   Physical Exam  Vitals reviewed. Constitutional: He is oriented to person, place, and time. He appears well-developed and well-nourished. No distress.  HENT:  Head: Normocephalic and atraumatic.  Mouth/Throat: Oropharynx is clear and  moist. No oropharyngeal exudate.  Eyes: Conjunctivae are normal. Right eye exhibits no discharge. Left eye exhibits no discharge. No scleral icterus.  Neck: Normal range of motion. Neck supple. No JVD present. No tracheal deviation present. No thyromegaly present.  Cardiovascular: Normal rate, regular rhythm, normal heart sounds and intact distal pulses.  Exam reveals no gallop and no friction rub.   No murmur heard. Pulmonary/Chest: Effort normal and breath sounds normal. No stridor. No respiratory distress. He has no wheezes. He has no rales. He exhibits no tenderness.  Abdominal: Soft. Bowel sounds are normal. He exhibits no distension and no mass. There is no tenderness. There is no rebound and no guarding.  Musculoskeletal: Normal range of motion. He exhibits no edema and no tenderness.  His joints appear normal, there is no swelling/crepitance/warmth  Lymphadenopathy:    He has no cervical adenopathy.  Neurological: He is oriented to person, place, and time.  Skin: Skin is warm and dry. No rash noted. He is not diaphoretic. No erythema. No pallor.  Psychiatric: He has a normal mood and affect. His behavior is normal. Judgment and thought content normal.     Lab Results  Component Value Date   WBC 10.7 Repeated and verified X2.* 11/27/2012   HGB 12.1* 11/27/2012   HCT 36.5* 11/27/2012   PLT 414.0 Repeated and verified X2.* 11/27/2012   GLUCOSE 89 11/18/2012   CHOL 174 09/27/2011   TRIG 289.0* 09/27/2011   HDL 37.50* 09/27/2011   LDLDIRECT 90.3 09/27/2011   ALT 40 11/18/2012   AST 23 11/18/2012   NA 138 11/18/2012   K 4.0 11/18/2012   CL 101 11/18/2012   CREATININE 1.2 11/18/2012   BUN 14 11/18/2012   CO2 25 11/18/2012   TSH 1.63 11/18/2012   PSA 5.03* 11/18/2012       Assessment & Plan:

## 2012-12-03 NOTE — Assessment & Plan Note (Signed)
I have asked him to upgrade the PPI to nexium and since he has developed an anemia I have sent a referral to GI

## 2012-12-03 NOTE — Patient Instructions (Signed)
Anemia, Nonspecific  Your exam and blood tests show you are anemic. This means your blood (hemoglobin) level is low. Normal hemoglobin values are 12 to 15 g/dL for females and 14 to 17 g/dL for males. Make a note of your hemoglobin level today. The hematocrit percent is also used to measure anemia. A normal hematocrit is 38% to 46% in females and 42% to 49% in males. Make a note of your hematocrit level today.  CAUSES   Anemia can be due to many different causes.   Excessive bleeding from periods (in women).   Intestinal bleeding.   Poor nutrition.   Kidney, thyroid, liver, and bone marrow diseases.  SYMPTOMS   Anemia can come on suddenly (acute). It can also come on slowly. Symptoms can include:   Minor weakness.   Dizziness.   Palpitations.   Shortness of breath.  Symptoms may be absent until half your hemoglobin is missing if it comes on slowly. Anemia due to acute blood loss from an injury or internal bleeding may require blood transfusion if the loss is severe. Hospital care is needed if you are anemic and there is significant continual blood loss.  TREATMENT    Stool tests for blood (Hemoccult) and additional lab tests are often needed. This determines the best treatment.   Further checking on your condition and your response to treatment is very important. It often takes many weeks to correct anemia.  Depending on the cause, treatment can include:   Supplements of iron.   Vitamins B12 and folic acid.   Hormone medicines.  If your anemia is due to bleeding, finding the cause of the blood loss is very important. This will help avoid further problems.  SEEK IMMEDIATE MEDICAL CARE IF:    You develop fainting, extreme weakness, shortness of breath, or chest pain.   You develop heavy vaginal bleeding.   You develop bloody or black, tarry stools or vomit up blood.   You develop a high fever, rash, repeated vomiting, or dehydration.  Document Released: 07/18/2004 Document Revised: 09/02/2011 Document  Reviewed: 04/25/2009  [employer] Patient Information 2014 [employer]

## 2013-02-11 ENCOUNTER — Other Ambulatory Visit: Payer: Self-pay | Admitting: Internal Medicine

## 2013-02-12 ENCOUNTER — Encounter: Payer: Self-pay | Admitting: Gastroenterology

## 2013-03-09 ENCOUNTER — Other Ambulatory Visit: Payer: Self-pay | Admitting: Gastroenterology

## 2013-03-09 NOTE — Telephone Encounter (Signed)
PLEASE MAKE AN OFFICE VISIT FOR FURTHER REFILLS  

## 2013-04-29 ENCOUNTER — Other Ambulatory Visit: Payer: Self-pay

## 2013-05-11 ENCOUNTER — Other Ambulatory Visit: Payer: Self-pay | Admitting: Internal Medicine

## 2013-05-18 ENCOUNTER — Encounter: Payer: Self-pay | Admitting: Family Medicine

## 2013-05-18 ENCOUNTER — Ambulatory Visit (INDEPENDENT_AMBULATORY_CARE_PROVIDER_SITE_OTHER): Payer: 59 | Admitting: Family Medicine

## 2013-05-18 ENCOUNTER — Other Ambulatory Visit (INDEPENDENT_AMBULATORY_CARE_PROVIDER_SITE_OTHER): Payer: 59

## 2013-05-18 VITALS — BP 134/84 | HR 80 | Wt 203.0 lb

## 2013-05-18 DIAGNOSIS — M7501 Adhesive capsulitis of right shoulder: Secondary | ICD-10-CM

## 2013-05-18 DIAGNOSIS — M25511 Pain in right shoulder: Secondary | ICD-10-CM

## 2013-05-18 DIAGNOSIS — M7500 Adhesive capsulitis of unspecified shoulder: Secondary | ICD-10-CM

## 2013-05-18 DIAGNOSIS — Z23 Encounter for immunization: Secondary | ICD-10-CM

## 2013-05-18 DIAGNOSIS — M25519 Pain in unspecified shoulder: Secondary | ICD-10-CM

## 2013-05-18 MED ORDER — MELOXICAM 15 MG PO TABS: 15.0000 mg | ORAL_TABLET | Freq: Every day | ORAL | Status: DC

## 2013-05-18 NOTE — Patient Instructions (Signed)
Very nice to meet you I want you to take meloxicam daily for 10 days.  Start exercises after thanksgiving.  Ice 20 minutes 2 times a day.  Come back again 3 weeks.

## 2013-05-18 NOTE — Progress Notes (Signed)
I'm seeing this patient by the request  of:  Sanda Linger, MD  CC: Right shoulder pain  HPI: Patient is a very pleasant 52 year old gentleman who is right-hand dominant coming in with right shoulder pain for 2 month history. Patient states he doesn't number one time when he was pulling his suitcase from any overhead compartment on the airplane. Patient states at that time the pain seems be worsening. Patient has a dull aching sensation with certain movements that last for minutes after the movement. Patient and can have the pain seemed to resolve for a while until he does another movement such as overhead or reaching behind his back. In addition he has pain when he is sleeping on it at night. Patient denies any radiation down the arm, denies any numbness or tingling. Patient also denies any back pain or neck pain. Patient states he feels he is losing some range of motion. Patient has tried Tylenol one time which did not help. Patient rates the severity of 9/10  Past medical, surgical, family and social history reviewed. Medications reviewed all in the electronic medical record.   Review of Systems: No headache, visual changes, nausea, vomiting, diarrhea, constipation, dizziness, abdominal pain, skin rash, fevers, chills, night sweats, weight loss, swollen lymph nodes, body aches, joint swelling, muscle aches, chest pain, shortness of breath, mood changes.   Objective:    Blood pressure 134/84, pulse 80, weight 203 lb (92.08 kg), SpO2 99.00%.   General: No apparent distress alert and oriented x3 mood and affect normal, dressed appropriately.  HEENT: Pupils equal, extraocular movements intact Respiratory: Patient's speak in full sentences and does not appear short of breath Cardiovascular: No lower extremity edema, non tender, no erythema Skin: Warm dry intact with no signs of infection or rash on extremities or on axial skeleton. Abdomen: Soft nontender Neuro: Cranial nerves II through XII are  intact, neurovascularly intact in all extremities with 2+ DTRs and 2+ pulses. Lymph: No lymphadenopathy of posterior or anterior cervical chain or axillae bilaterally.  Gait normal with good balance and coordination.  MSK: Non tender with full range of motion and good stability and symmetric strength and tone of  elbows, wrist, hip, knee and ankles bilaterally.  Shoulder: Inspection reveals no abnormalities, atrophy or asymmetry. Palpation is normal with no tenderness over AC joint or bicipital groove. ROM is restricted to 15 and external rotation, internal rotation to lateral hip, and forward flexion to 160 passively. Patient has full range motion of the contralateral side Rotator cuff strength normal throughout. Positive signs of impingement with negative Neer and Hawkin's tests, empty can sign. Speeds and Yergason's tests normal. No labral pathology noted with negative Obrien's, negative clunk and good stability. Normal scapular function observed. No painful arc and no drop arm sign. No apprehension sign  MSK US performed of: Right shoulder This study was ordered, performed, and interpreted by Terrilee Files D.O.  Shoulder:   Supraspinatus:  A significant hypoechoic changes and potential increasing and Doppler flow. Most of this seems to be go across the capsule more than the tendon itself. Patient also has findings of increasing bursa inflammation. No true tear appreciated.  Infraspinatus:  Appears normal on long and transverse views. Subscapularis:  Significant bursa enlargement with some rotator cuff tendinopathy but no true tear appreciated.. Teres Minor:  Appears normal on long and transverse views. AC joint:  Capsule distended.  Glenohumeral Joint:  Appears normal without effusion. Glenoid Labrum:  Intact without visualized tears. Patient does have significant thickening of  the posterior capsule. Biceps Tendon:  Appears normal on long and transverse views, no fraying of tendon,  tendon located in intertubercular groove, no subluxation with shoulder internal or external rotation. No increased power doppler signal.  Impression: Likely early adhesive capsulitis with rotator cuff tendinopathy and subacromial bursitis  Procedure: Real-time Ultrasound Guided Injection of right glenohumeral joint Device: GE Logiq E  Ultrasound guided injection is preferred based studies that show increased duration, increased effect, greater accuracy, decreased procedural pain, increased response rate with ultrasound guided versus blind injection.  Verbal informed consent obtained.  Time-out conducted.  Noted no overlying erythema, induration, or other signs of local infection.  Skin prepped in a sterile fashion.  Local anesthesia: Topical Ethyl chloride.  With sterile technique and under real time ultrasound guidance:  Joint visualized.  23g 1  inch needle inserted posterior approach. Pictures taken for needle placement. Patient did have injection of 2 cc of 1% lidocaine, 2 cc of 0.5% Marcaine, and 1.0 cc of Kenalog 40 mg/dL. Completed without difficulty  Pain immediately resolved suggesting accurate placement of the medication.  Advised to call if fevers/chills, erythema, induration, drainage, or persistent bleeding.  Images permanently stored and available for review in the ultrasound unit.  Impression: Technically successful ultrasound guided injection.    Impression and Recommendations:     This case required medical decision making of moderate complexity.

## 2013-05-18 NOTE — Assessment & Plan Note (Signed)
Patient had injection as described above. Home exercise program given Meloxicam daily for ten-day Discussed icing protocol Patient will come back in 3-4 weeks for further evaluation.

## 2013-05-19 DIAGNOSIS — Z23 Encounter for immunization: Secondary | ICD-10-CM

## 2013-06-09 ENCOUNTER — Ambulatory Visit (INDEPENDENT_AMBULATORY_CARE_PROVIDER_SITE_OTHER): Payer: 59 | Admitting: Family Medicine

## 2013-06-09 ENCOUNTER — Encounter: Payer: Self-pay | Admitting: Family Medicine

## 2013-06-09 VITALS — BP 134/86 | HR 83 | Wt 197.0 lb

## 2013-06-09 DIAGNOSIS — M7501 Adhesive capsulitis of right shoulder: Secondary | ICD-10-CM

## 2013-06-09 DIAGNOSIS — M7500 Adhesive capsulitis of unspecified shoulder: Secondary | ICD-10-CM

## 2013-06-09 IMAGING — CT CT ABD-PELV W/ CM
2 of 5 series · 17 of 46 positions shown, 19 images · IV contrast (Omnipaque 300)
Comparison: None.

CLINICAL DATA: Persistent right abdominal pain, gastroesophageal
reflux

CT ABDOMEN AND PELVIS WITH CONTRAST
TECHNIQUE: Multidetector CT imaging of the abdomen and pelvis was
performed following the standard protocol during bolus
administration of intravenous contrast.
Contrast: 100mL OMNIPAQUE IOHEXOL 300 MG/ML  SOLN

[Series 2: abd/ pel 5mm · axial · 0.69mm/px · z∈[-459,-49]mm · 14 of 92 slices shown, 16 images]
[im 5/92  soft-tissue]
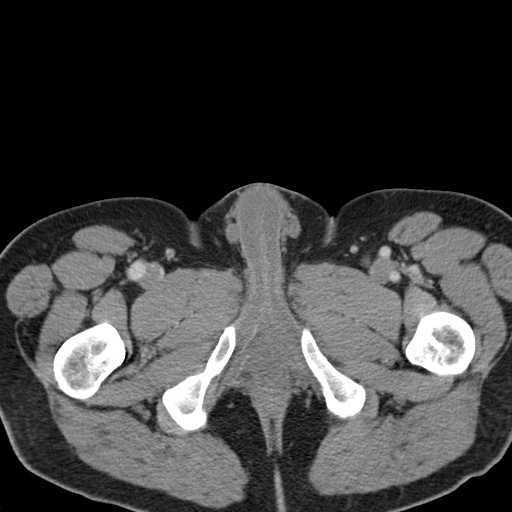
[im 5/92  bone]
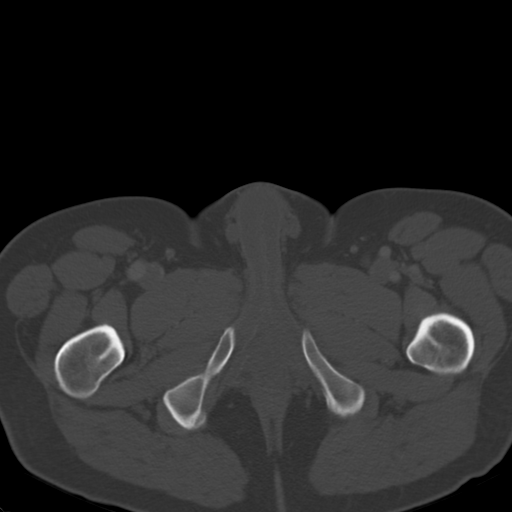
[im 14/92  soft-tissue]
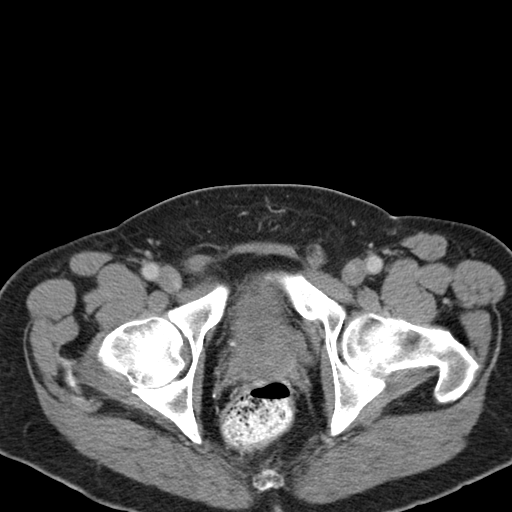
[im 19/92  soft-tissue]
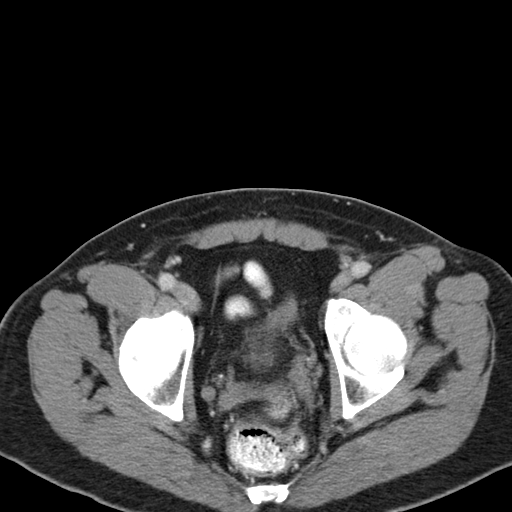
[im 23/92  soft-tissue]
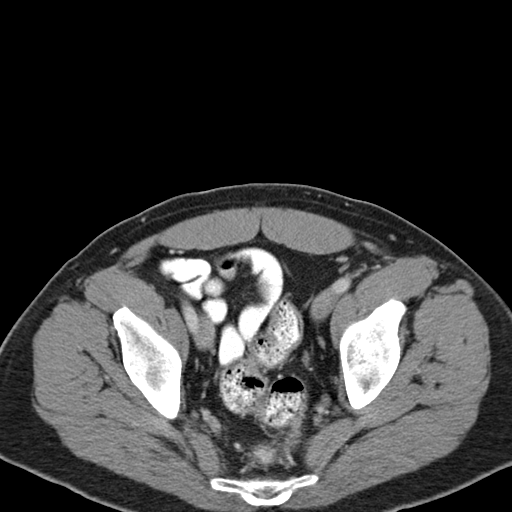
[im 32/92  soft-tissue]
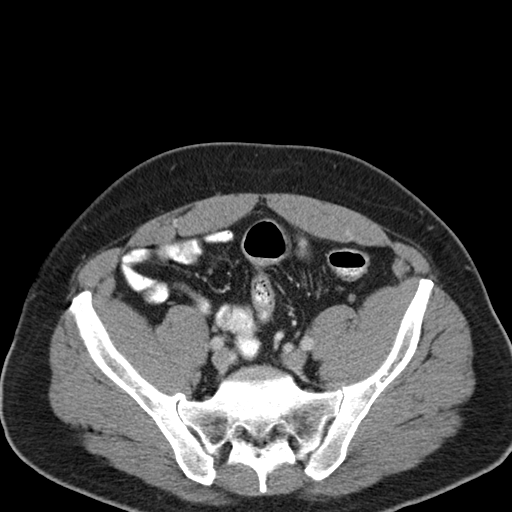
[im 37/92  soft-tissue]
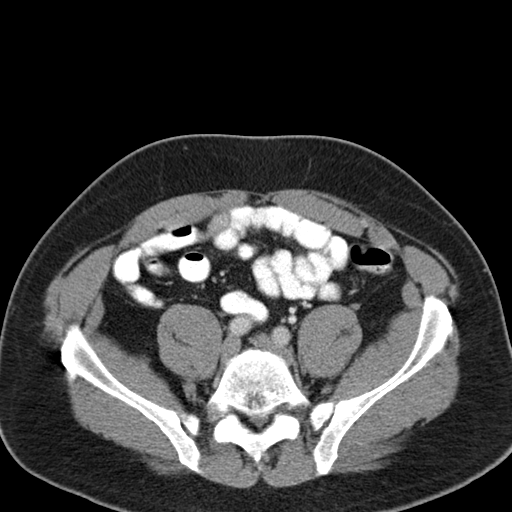
[im 41/92  soft-tissue]
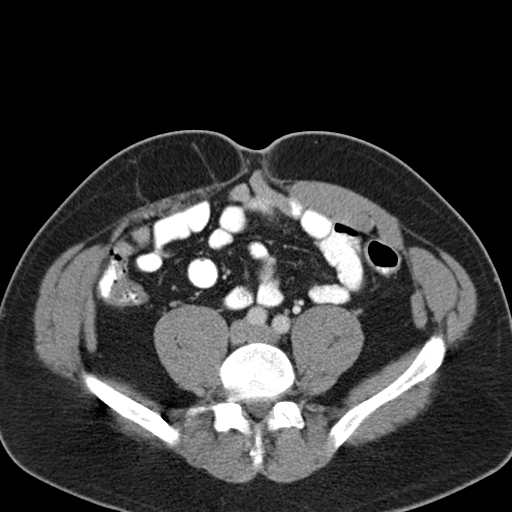
[im 51/92  soft-tissue]
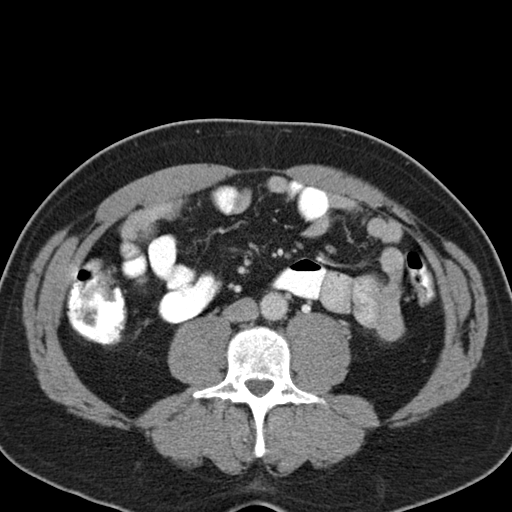
[im 55/92  soft-tissue]
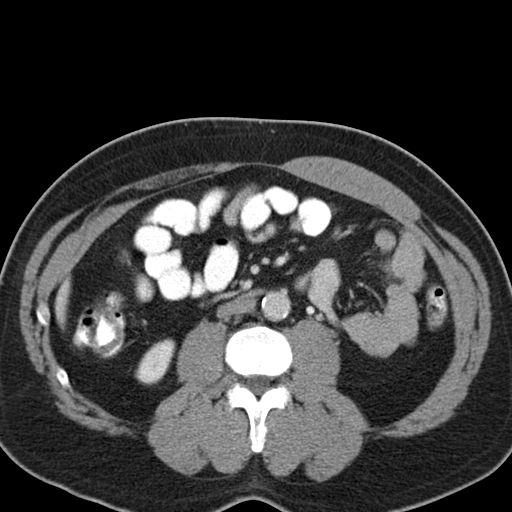
[im 55/92  bone]
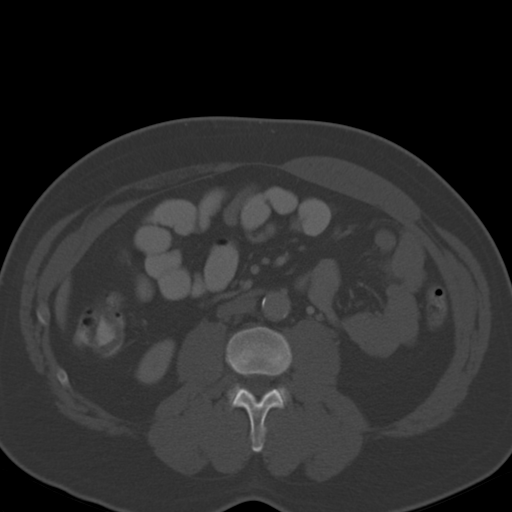
[im 60/92  soft-tissue]
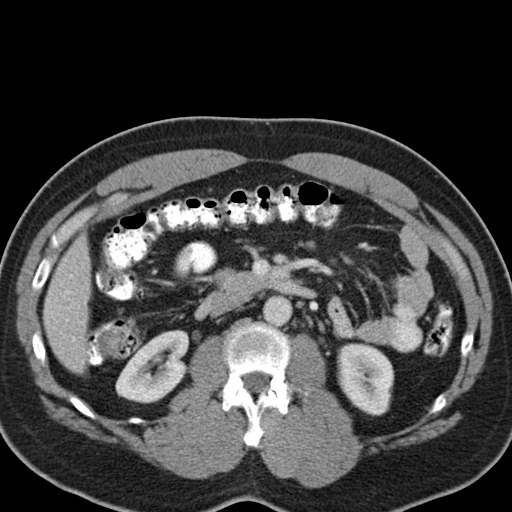
[im 69/92  soft-tissue]
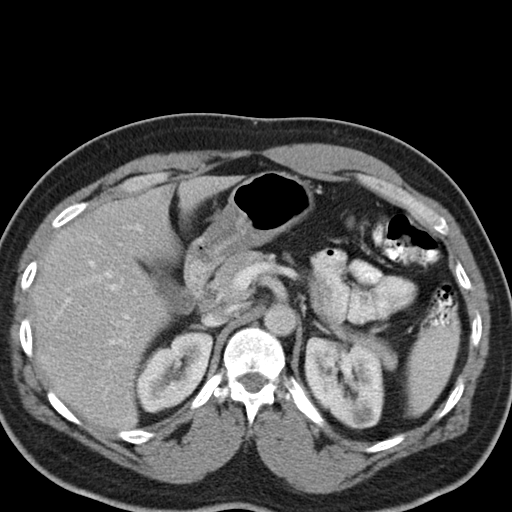
[im 73/92  soft-tissue]
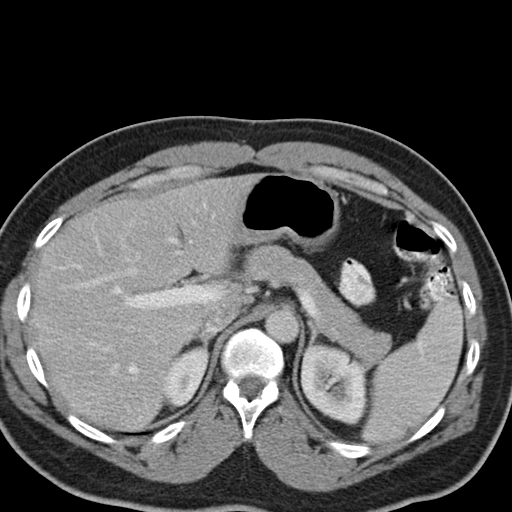
[im 78/92  soft-tissue]
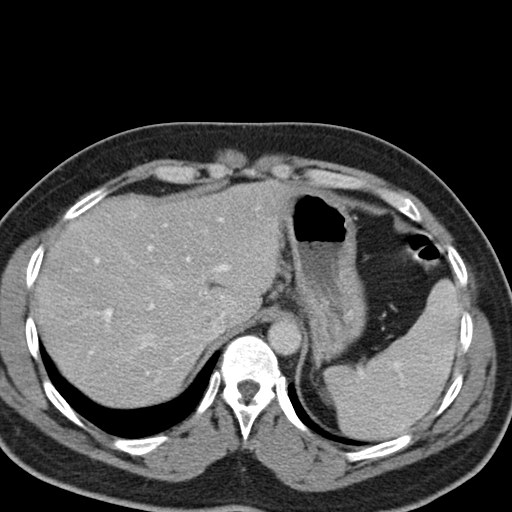
[im 87/92  soft-tissue]
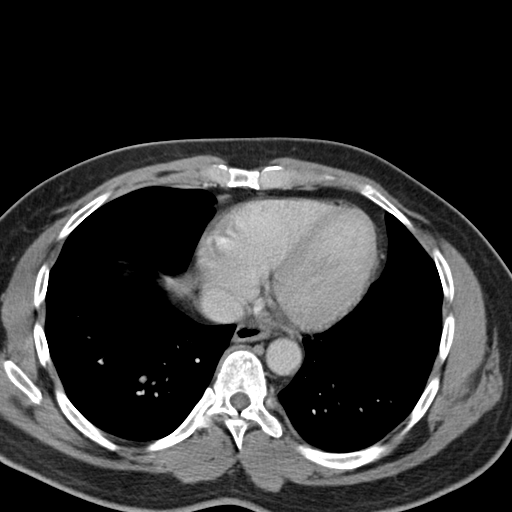

[Series 602: cor · coronal · 0.93mm/px · 3 of 107 slices shown]
[im 36/107  soft-tissue]
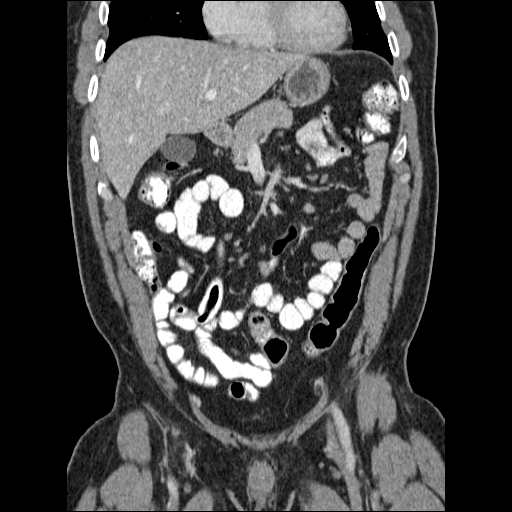
[im 48/107  soft-tissue]
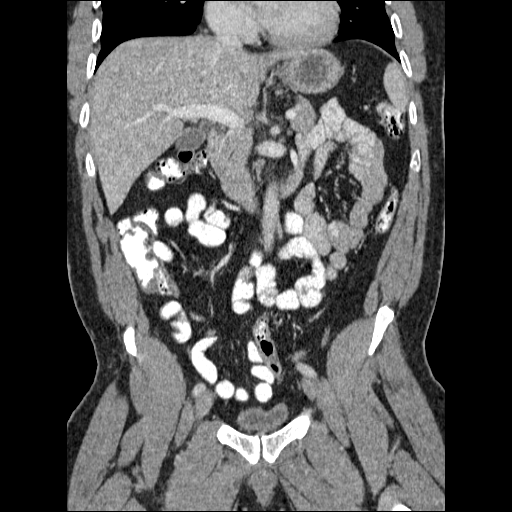
[im 59/107  soft-tissue]
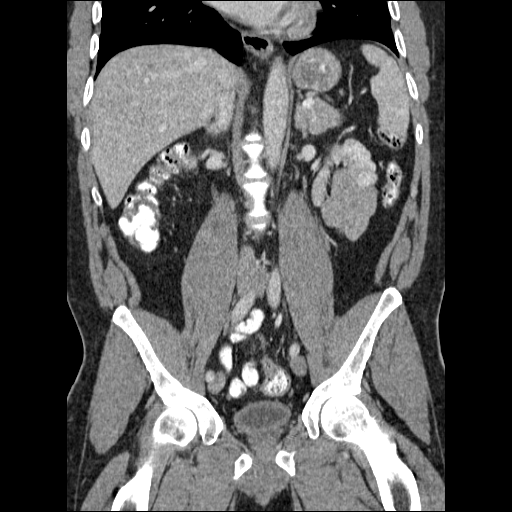

[17 of 46 positions shown; findings below may reference images not displayed]

FINDINGS: Lung bases clear.  No lower lobe pneumonia.  No
pericardial or pleural effusion.  Normal heart size.  Negative for
hiatal hernia.

Abdomen:  Liver, gallbladder, biliary system, pancreas, spleen,
adrenal glands, and right kidney are within normal limits and
demonstrate no acute finding.  Left kidney demonstrates an
incidental posterior mid pole 12 mm cyst, image 23 and is otherwise
normal.  No hydronephrosis, obstructing urinary tract ureteral
calculus evident.

No abdominal free fluid, fluid collection, hemorrhage, adenopathy,
or abscess.

Atherosclerosis of the aorta without aneurysm or occlusive process.

Negative for bowel obstruction, dilatation, ileus, or free air.

Pelvis:  Normal appendix identified.  No pelvic free fluid, fluid
collection, hemorrhage, hematoma, abscess, adenopathy, inguinal
abnormality, or hernia.  Pelvic calcifications appear to represent
venous phleboliths.  Urinary bladder is decompressed.

No acute or abnormal osseous finding
IMPRESSION: No acute intra-abdominal or pelvic process.

Incidental left renal cyst

Normal appendix

## 2013-06-09 NOTE — Patient Instructions (Signed)
You are doing great Happy Holidays We did another injection today Continue exercises 3 times a week See you in 1 month if not perfect.

## 2013-06-09 NOTE — Progress Notes (Signed)
Pre-visit discussion using our clinic review tool. No additional management support is needed unless otherwise documented below in the visit note.  

## 2013-06-09 NOTE — Assessment & Plan Note (Signed)
Patient had injection again of steroids this time with an anterior approach into the intra-articular joint under ultrasound guidance. Patient did tolerate the procedure very well and had almost complete resolution of the pain but still no significant increase in range of motion. Patient encouraged to continue the exercises on at least 3 times a week. Meloxicam as needed. Patient travels to formal physical therapy is impossible. We did do cortisone injections in this frequency because of evidence shows that this can be beneficial. Patient will come back in one month for further evaluation. If he continues to have pain we'll consider further imaging.

## 2013-06-09 NOTE — Progress Notes (Signed)
CC: Right shoulder pain  HPI: Patient is following up for right shoulder pain. Patient was found to have adhesive capsulitis and was given a steroid injection, anti-inflammatories, and icing protocol. Patient states that he is approximately 85% better. Patient states that he is able to have more increased range of motion but still has trouble with reaching behind his back. Patient states that the pain has considerably improved as well. Patient continues to do the exercises at least 4 times a week which has been beneficial. Patient is no longer taking the meloxicam on a regular basis. Denies any new symptoms such as numbness or weakness.  Past medical, surgical, family and social history reviewed. Medications reviewed all in the electronic medical record.   Review of Systems: No headache, visual changes, nausea, vomiting, diarrhea, constipation, dizziness, abdominal pain, skin rash, fevers, chills, night sweats, weight loss, swollen lymph nodes, body aches, joint swelling, muscle aches, chest pain, shortness of breath, mood changes.   Objective:    Blood pressure 134/86, pulse 83, weight 197 lb (89.359 kg), SpO2 98.00%.   General: No apparent distress alert and oriented x3 mood and affect normal, dressed appropriately.  HEENT: Pupils equal, extraocular movements intact Respiratory: Patient's speak in full sentences and does not appear short of breath Cardiovascular: No lower extremity edema, non tender, no erythema Skin: Warm dry intact with no signs of infection or rash on extremities or on axial skeleton. Abdomen: Soft nontender Neuro: Cranial nerves II through XII are intact, neurovascularly intact in all extremities with 2+ DTRs and 2+ pulses. Lymph: No lymphadenopathy of posterior or anterior cervical chain or axillae bilaterally.  Gait normal with good balance and coordination.  MSK: Non tender with full range of motion and good stability and symmetric strength and tone of  elbows, wrist,  hip, knee and ankles bilaterally.  Shoulder: Inspection reveals no abnormalities, atrophy or asymmetry. Palpation is normal with no tenderness over AC joint or bicipital groove. ROM is restricted to 20 with external rotation, internal rotation to L5, and forward flexion to 170 passively. Patient has full range motion of the contralateral side this is improved from previous exam Rotator cuff strength normal throughout. Positive signs of impingement with negative Neer and Hawkin's tests, but negative empty can sign. Speeds and Yergason's tests normal. No labral pathology noted with negative Obrien's, negative clunk and good stability. Normal scapular function observed. No painful arc and no drop arm sign. No apprehension sign   Procedure: Real-time Ultrasound Guided Injection of right glenohumeral joint Device: GE Logiq E  Ultrasound guided injection is preferred based studies that show increased duration, increased effect, greater accuracy, decreased procedural pain, increased response rate with ultrasound guided versus blind injection.  Verbal informed consent obtained.  Time-out conducted.  Noted no overlying erythema, induration, or other signs of local infection.  Skin prepped in a sterile fashion.  Local anesthesia: Topical Ethyl chloride.  With sterile technique and under real time ultrasound guidance:  Joint visualized.  23g 1  inch needle inserted anterior approach. Pictures taken for needle placement. Patient did have injection of 2 cc of 1% lidocaine, 2 cc of 0.5% Marcaine, and 1.0 cc of Kenalog 40 mg/dL. Completed without difficulty  Pain immediately resolved suggesting accurate placement of the medication.  Advised to call if fevers/chills, erythema, induration, drainage, or persistent bleeding.  Images permanently stored and available for review in the ultrasound unit.  Impression: Technically successful ultrasound guided injection.    Impression and Recommendations:      This  case required medical decision making of moderate complexity.

## 2013-07-09 ENCOUNTER — Ambulatory Visit: Payer: 59 | Admitting: Family Medicine

## 2013-08-13 ENCOUNTER — Ambulatory Visit (INDEPENDENT_AMBULATORY_CARE_PROVIDER_SITE_OTHER): Payer: 59 | Admitting: Internal Medicine

## 2013-08-13 ENCOUNTER — Encounter: Payer: Self-pay | Admitting: Internal Medicine

## 2013-08-13 VITALS — BP 110/60 | HR 66 | Temp 98.7°F | Wt 196.2 lb

## 2013-08-13 DIAGNOSIS — J209 Acute bronchitis, unspecified: Secondary | ICD-10-CM

## 2013-08-13 MED ORDER — AZITHROMYCIN 250 MG PO TABS: ORAL_TABLET | ORAL | Status: DC

## 2013-08-13 MED ORDER — BENZONATATE 200 MG PO CAPS: 200.0000 mg | ORAL_CAPSULE | Freq: Three times a day (TID) | ORAL | Status: DC | PRN

## 2013-08-13 NOTE — Progress Notes (Signed)
   Subjective:    Patient ID: Howard [name], male    DOB: 07/31/1959, 53 y.o.   MRN: [id number]  HPI  Symptoms began 08/08/13 as a sore throat followed by congestion mainly in his chest. He's had a cough which is intermittently productive of clear-yellow sputum. The cough has been worse at night. NyQuil and Mucinex have been of benefit during the day but NyQuil and Robitussin products have not helped and nocturnal cough  He believes he has been exposed to ill individuals.  He does have no history of asthma. He smokes an occasional cigar     Review of Systems He does not have fever, chills, or sweats  He also denies significant frontal sinus pain, facial pain, significant nasal purulence, dental pain, otic pain, otic discharge.  The cough is not been associated with shortness rest or wheezing.      Objective:   Physical Exam General appearance:good health ;well nourished; no acute distress or increased work of breathing is present.  No  lymphadenopathy about the head, neck, or axilla noted.   Eyes: No conjunctival inflammation or lid edema is present.  Ears:  External ear exam shows no significant lesions or deformities. Left tympanic membrane is erythematous with decreased light reflex. Right tympanic membrane is normal .  Nose:  External nasal examination shows no deformity or inflammation. Nasal mucosa are dry w/o lesions or exudates. No septal dislocation or deviation.No obstruction to airflow.   Oral exam: Dental hygiene is good; lips and gums are healthy appearing.There is no oropharyngeal erythema or exudate noted.   Neck:  No deformities,  masses, or tenderness noted.   Supple with full range of motion without pain.   Heart:  Normal rate and regular rhythm. S1 and S2 normal without gallop, murmur, click, rub or other extra sounds.   Lungs:Chest clear to auscultation; no wheezes, rhonchi,rales ,or rubs present.No increased work of breathing.    Extremities:  No cyanosis,  edema, or clubbing  noted    Skin: Warm & dry          Assessment & Plan:  #1 acute bronchitis w/o bronchospasm #2 L Eustachian tube edema  Plan: See orders and recommendations

## 2013-08-13 NOTE — Patient Instructions (Signed)
Plain Mucinex (NOT D) for thick secretions ;force NON dairy fluids .   Nasal cleansing in the shower as discussed with lather of mild shampoo.After 10 seconds wash off lather while  exhaling through nostrils. Make sure that all residual soap is removed to prevent irritation.  Flonase OR Nasacort AQ 1 spray in each nostril twice a day as needed. Use the "crossover" technique into opposite nostril spraying toward opposite ear @ 45 degree angle, not straight up into nostril.  Use a Neti pot daily only  as needed for significant sinus congestion; going from open side to congested side . Plain Allegra (NOT D )  160 daily , Loratidine 10 mg , OR Zyrtec 10 mg @ bedtime  as needed for itchy eyes & sneezing. Drink thin  fluids liberally through the day and chew sugarless gum . Do the Valsalva maneuver several times a day to "pop" ears open.

## 2013-08-13 NOTE — Progress Notes (Signed)
Pre visit review using our clinic review tool, if applicable. No additional management support is needed unless otherwise documented below in the visit note. 

## 2013-08-17 ENCOUNTER — Telehealth: Payer: Self-pay | Admitting: Internal Medicine

## 2013-08-17 DIAGNOSIS — J209 Acute bronchitis, unspecified: Secondary | ICD-10-CM

## 2013-08-17 MED ORDER — BENZONATATE 200 MG PO CAPS: 200.0000 mg | ORAL_CAPSULE | Freq: Three times a day (TID) | ORAL | Status: DC | PRN

## 2013-08-17 NOTE — Telephone Encounter (Signed)
Requesting Benzonatate 20mg  Take 1 capsule 3 times a day as needed for cough Last refill:08-13-13 Last OV:08-13-13 Please advise.//AB/CMA

## 2013-08-17 NOTE — Telephone Encounter (Signed)
Rx sent to the pharmacy by e-script.  Informed the pt of Dr. Frederik PearHopper's note below.  Pt understood and agreed.//AB/CMA

## 2013-08-17 NOTE — Telephone Encounter (Signed)
#  15 tid prn only See Dr Yetta BarreJones if no better

## 2013-11-30 ENCOUNTER — Other Ambulatory Visit: Payer: Self-pay

## 2013-11-30 MED ORDER — OLMESARTAN MEDOXOMIL-HCTZ 40-12.5 MG PO TABS: ORAL_TABLET | ORAL | Status: DC

## 2013-12-14 ENCOUNTER — Other Ambulatory Visit: Payer: Self-pay

## 2013-12-14 MED ORDER — OLMESARTAN MEDOXOMIL-HCTZ 40-12.5 MG PO TABS: ORAL_TABLET | ORAL | Status: DC

## 2014-05-18 ENCOUNTER — Other Ambulatory Visit: Payer: Self-pay | Admitting: Internal Medicine

## 2014-11-24 ENCOUNTER — Encounter: Payer: Self-pay | Admitting: Family Medicine

## 2014-11-24 ENCOUNTER — Ambulatory Visit (INDEPENDENT_AMBULATORY_CARE_PROVIDER_SITE_OTHER): Payer: 59 | Admitting: Family Medicine

## 2014-11-24 ENCOUNTER — Other Ambulatory Visit (INDEPENDENT_AMBULATORY_CARE_PROVIDER_SITE_OTHER): Payer: 59

## 2014-11-24 VITALS — BP 124/84 | HR 91 | Wt 208.0 lb

## 2014-11-24 DIAGNOSIS — M25511 Pain in right shoulder: Secondary | ICD-10-CM | POA: Diagnosis not present

## 2014-11-24 DIAGNOSIS — M7501 Adhesive capsulitis of right shoulder: Secondary | ICD-10-CM

## 2014-11-24 NOTE — Assessment & Plan Note (Signed)
She was given an injection today and tolerated the procedure very well. We discussed icing regimen and home exercises. Patient given another handouts and he will try to do this more regularly. Patient given a trial topical anti-inflammatory think will be helpful as well. We will avoid oral anti-for secondary to his history of peptic ulcer disease. Patient and will come back and see me again in 3-4 weeks for further evaluation and treatment.

## 2014-11-24 NOTE — Progress Notes (Signed)
  CC: Right shoulder pain  HPI: Patient is following up for right shoulder pain. Patient was found to have adhesive capsulitis  In 2014. Patient states he is been doing very good until the last month when he is starting have increasing pain again. Patient states that it is not as bad as it was previously. Patient denies any radiation of pain any numbness or weakness. Patient states that he can still do his daily activities but does not want the patient to get as bad as it was previously.  Past medical, surgical, family and social history reviewed. Medications reviewed all in the electronic medical record.   Review of Systems: No headache, visual changes, nausea, vomiting, diarrhea, constipation, dizziness, abdominal pain, skin rash, fevers, chills, night sweats, weight loss, swollen lymph nodes, body aches, joint swelling, muscle aches, chest pain, shortness of breath, mood changes.   Objective:    Blood pressure 124/84, pulse 91, weight 208 lb (94.348 kg), SpO2 97 %.   General: No apparent distress alert and oriented x3 mood and affect normal, dressed appropriately.  HEENT: Pupils equal, extraocular movements intact Respiratory: Patient's speak in full sentences and does not appear short of breath Cardiovascular: No lower extremity edema, non tender, no erythema Skin: Warm dry intact with no signs of infection or rash on extremities or on axial skeleton. Abdomen: Soft nontender Neuro: Cranial nerves II through XII are intact, neurovascularly intact in all extremities with 2+ DTRs and 2+ pulses. Lymph: No lymphadenopathy of posterior or anterior cervical chain or axillae bilaterally.  Gait normal with good balance and coordination.  MSK: Non tender with full range of motion and good stability and symmetric strength and tone of  elbows, wrist, hip, knee and ankles bilaterally.  Shoulder: Right Inspection reveals no abnormalities, atrophy or asymmetry. Palpation is normal with no tenderness  over AC joint or bicipital groove. Patient does have mild limitation in 4 to lacking the last 5 of forward flexion as well as the last 10 of external rotation. Rotator cuff strength normal throughout. Positive signs of impingement with negative Neer and Hawkin's tests, but negative empty can sign. Speeds and Yergason's tests normal. No labral pathology noted with negative Obrien's, negative clunk and good stability. Normal scapular function observed. No painful arc and no drop arm sign. No apprehension sign   Procedure: Real-time Ultrasound Guided Injection of right glenohumeral joint Device: GE Logiq E  Ultrasound guided injection is preferred based studies that show increased duration, increased effect, greater accuracy, decreased procedural pain, increased response rate with ultrasound guided versus blind injection.  Verbal informed consent obtained.  Time-out conducted.  Noted no overlying erythema, induration, or other signs of local infection.  Skin prepped in a sterile fashion.  Local anesthesia: Topical Ethyl chloride.  With sterile technique and under real time ultrasound guidance:  Joint visualized.  23g 1  inch needle inserted anterior approach. Pictures taken for needle placement. Patient did have injection of 2 cc of 1% lidocaine, 2 cc of 0.5% Marcaine, and 1.0 cc of Kenalog 40 mg/dL. Completed without difficulty  Pain immediately resolved suggesting accurate placement of the medication.  Advised to call if fevers/chills, erythema, induration, drainage, or persistent bleeding.  Images permanently stored and available for review in the ultrasound unit.  Impression: Technically successful ultrasound guided injection.    Impression and Recommendations:     This case required medical decision making of moderate complexity.

## 2014-11-24 NOTE — Progress Notes (Signed)
Pre visit review using our clinic review tool, if applicable. No additional management support is needed unless otherwise documented below in the visit note. 

## 2014-11-24 NOTE — Patient Instructions (Signed)
Good to see you again Ice is your friend Exercises 3 times a week.  pennsaid pinkie amount topically 2 times daily as needed.  See me again in 3-4 weeks.

## 2014-11-28 ENCOUNTER — Telehealth: Payer: Self-pay | Admitting: Internal Medicine

## 2014-11-28 ENCOUNTER — Other Ambulatory Visit: Payer: Self-pay | Admitting: Internal Medicine

## 2014-11-28 ENCOUNTER — Encounter: Payer: Self-pay | Admitting: Internal Medicine

## 2014-11-28 DIAGNOSIS — I10 Essential (primary) hypertension: Secondary | ICD-10-CM

## 2014-11-28 MED ORDER — LOSARTAN POTASSIUM-HCTZ 100-12.5 MG PO TABS: 1.0000 | ORAL_TABLET | Freq: Every day | ORAL | Status: DC

## 2014-11-28 NOTE — Telephone Encounter (Signed)
Patient states that he was advised by dr Katrinka Blazingsmith that he would prescribe a new and cheaper BP medication, separate from Benicar. He is advising that he would like to do that. Patient is leaving town on Wednesday for a week. Please call patient to advise. [facility] [address].

## 2014-11-28 NOTE — Telephone Encounter (Signed)
Sent in losartan and see what the cost of that is.  If too much we will divide the medicine out and get it cheaper.

## 2014-11-28 NOTE — Telephone Encounter (Signed)
Discussed with pt, he states he will go get the medicine & if it's too expensive he'll call back & let me know.

## 2014-11-30 ENCOUNTER — Telehealth: Payer: Self-pay | Admitting: Internal Medicine

## 2014-11-30 NOTE — Telephone Encounter (Signed)
Spoke to pharmacist & advised her the pt is no longer taking the benicar because it was $600 out of pocket so Dr. Katrinka BlazingSmith switched the pt to losartan-hctz & the pt picked it up yesterday from the local pharmacy.

## 2014-11-30 NOTE — Telephone Encounter (Signed)
[facility] is trying to do a refill for olmesartan-hydrochlorothiazide (BENICAR HCT) 40-12.5 MG per tablet [[id number]. Dr. Katrinka BlazingSmith had just prescibed losartan-hydrochlorothiazide (HYZAAR) 100-12.5 MG per tablet [[id number] a couple days ago and [facility] wants to know if they should delete the refill request for Benicar. There phone # is [phone]. Reference #[id number]

## 2015-01-31 ENCOUNTER — Encounter: Payer: Self-pay | Admitting: Family Medicine

## 2015-01-31 ENCOUNTER — Other Ambulatory Visit: Payer: Self-pay | Admitting: Family Medicine

## 2015-01-31 MED ORDER — AMLODIPINE BESYLATE 5 MG PO TABS: 5.0000 mg | ORAL_TABLET | Freq: Every day | ORAL | Status: DC

## 2015-02-20 ENCOUNTER — Other Ambulatory Visit: Payer: Self-pay

## 2015-03-13 ENCOUNTER — Other Ambulatory Visit: Payer: Self-pay | Admitting: *Deleted

## 2015-03-13 MED ORDER — AMLODIPINE BESYLATE 5 MG PO TABS: 5.0000 mg | ORAL_TABLET | Freq: Every day | ORAL | Status: DC

## 2015-03-13 NOTE — Telephone Encounter (Signed)
Refill done.  

## 2015-03-17 ENCOUNTER — Other Ambulatory Visit: Payer: Self-pay | Admitting: Internal Medicine

## 2015-03-27 ENCOUNTER — Encounter: Payer: Self-pay | Admitting: Internal Medicine

## 2015-03-27 ENCOUNTER — Telehealth: Payer: Self-pay | Admitting: Internal Medicine

## 2015-03-27 ENCOUNTER — Ambulatory Visit (INDEPENDENT_AMBULATORY_CARE_PROVIDER_SITE_OTHER): Payer: 59 | Admitting: Internal Medicine

## 2015-03-27 ENCOUNTER — Encounter: Payer: Self-pay | Admitting: Family Medicine

## 2015-03-27 VITALS — BP 118/80 | HR 78 | Temp 97.8°F | Ht 72.0 in | Wt 214.5 lb

## 2015-03-27 DIAGNOSIS — N529 Male erectile dysfunction, unspecified: Secondary | ICD-10-CM | POA: Diagnosis not present

## 2015-03-27 DIAGNOSIS — I10 Essential (primary) hypertension: Secondary | ICD-10-CM

## 2015-03-27 DIAGNOSIS — Z23 Encounter for immunization: Secondary | ICD-10-CM

## 2015-03-27 DIAGNOSIS — Z0000 Encounter for general adult medical examination without abnormal findings: Secondary | ICD-10-CM

## 2015-03-27 MED ORDER — SILDENAFIL CITRATE 100 MG PO TABS: 100.0000 mg | ORAL_TABLET | ORAL | Status: AC

## 2015-03-27 MED ORDER — LOSARTAN POTASSIUM-HCTZ 100-12.5 MG PO TABS: 1.0000 | ORAL_TABLET | Freq: Every day | ORAL | Status: DC

## 2015-03-27 NOTE — Assessment & Plan Note (Addendum)
Well controlled, stable Restart regular exercise Continue current medications  -  No refill needed today

## 2015-03-27 NOTE — Progress Notes (Signed)
   Subjective:    Patient ID: Howard [name], male    DOB: Oct 22, 1960, 54 y.o.   MRN: [id number]  HPI   He is here for routine follow up of his hypertension and ED and for medication renewal.   Hypertension: He is taking his medication daily. He is compliant with a low sodium diet.  He denies chest pain, palpitations, edema, shortness of breath and regular headaches. He is exercising regularly typically, but has not been exercising recently.  He does not monitor his blood pressure at home.    ED: He takes viagra as needed and tolerates the medication without side effects.  He would like to get a renewal of the medication today.  He denies difficulty initiating urination, dysuria and frequent urination.  Medications and allergies reviewed with patient and updated if appropriate.   Review of Systems  Constitutional: Negative for fever and chills.  Respiratory: Negative for cough, shortness of breath and wheezing.   Cardiovascular: Negative for chest pain, palpitations and leg swelling.  Neurological: Negative for dizziness, light-headedness and headaches.       Objective:   Physical Exam  Constitutional: He appears well-developed and well-nourished. No distress.  Neck: Neck supple. No thyromegaly present.  No carotid bruit  Cardiovascular: Normal rate, regular rhythm and normal heart sounds.   No murmur heard. Pulmonary/Chest: Effort normal and breath sounds normal. No respiratory distress. He has no wheezes.  Genitourinary:  Deferred - will return for a PE  Musculoskeletal: He exhibits no edema.  Lymphadenopathy:    He has no cervical adenopathy.     Filed Vitals:   03/27/15 1357  BP: 118/80  Pulse: 78  Temp: 97.8 F (36.6 C)   Filed Weights   03/27/15 1357  Weight: 214 lb 8 oz (97.297 kg)   Body mass index is 29.09 kg/(m^2).       Assessment & Plan:   Flu shot today Never had a colonoscopy - referred today Will schedule a PE with his pcp, Howard [name]  See  problem list for complete assessment and plan

## 2015-03-27 NOTE — Patient Instructions (Signed)
You received the flu shot today.  A referral was ordered for GI to schedule your colonoscopy.  Medications reviewed and updated.  No changes recommended at this time.  Your prescription(s) have been submitted to your pharmacy.    Please schedule a physical exam with Dr. Yetta Barre

## 2015-03-27 NOTE — Progress Notes (Signed)
Pre visit review using our clinic review tool, if applicable. No additional management support is needed unless otherwise documented below in the visit note. 

## 2015-03-27 NOTE — Telephone Encounter (Signed)
Pt is requesting refill for losartan-hydrochlorothiazide (HYZAAR) 100-12.5 MG per tablet [[id number]] [facility]

## 2015-03-27 NOTE — Assessment & Plan Note (Addendum)
Uses viagra as needed and would like a refill  Tolerates medication without side effects Viagra renewal sent to pharmacy

## 2015-06-28 ENCOUNTER — Encounter: Payer: 59 | Admitting: Internal Medicine

## 2016-04-07 ENCOUNTER — Other Ambulatory Visit: Payer: Self-pay | Admitting: Internal Medicine

## 2016-04-07 ENCOUNTER — Other Ambulatory Visit: Payer: Self-pay | Admitting: Family Medicine

## 2016-05-12 NOTE — Progress Notes (Signed)
Tawana ScaleZach Jadiel Schmieder D.O. [facility] [address] [place], [place] [address] Phone: [phone] Subjective:    I'm seeing this patient by the request  of:  Sanda Lingerhomas Jones, MD   CC: Shoulder pain right  BJY:[id number]:Subjective  Howard [name] is a 55 y.o. male coming in with complaint of right shoulder pain patient was last seen greater than one and a half years ago. Was diagnosed with more of an adhesive capsulitis. Patient states This feels very similar to what he had previously. Concern have decreasing range of motion. Increasing pain. Feels that the repetitive activity that he has to do with traveling seems to be the most concerning aspect. Patient denies any significant radiation down the arm or any numbness or tingling. States that there maybe some mild weakness but nothing severe. Is waking him up at night. Was doing very well until the last couple months. Does not remember any specific injury.     Past Medical History:  Diagnosis Date  . 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 ALLERGIC RHINITIS 03/29/2007  . ECZEMA, ATOPIC 08/22/2010  . ELECTROCARDIOGRAM, ABNORMAL 02/23/2009  . ERECTILE DYSFUNCTION, SECONDARY TO MEDICATION 02/23/2009  . Gastric ulcer 11/08/2011  . GERD (gastroesophageal reflux disease)   . 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 HYPERLIPIDEMIA 03/29/2007  . HYPERTENSION 03/29/2007  . LOW BACK PAIN 03/29/2007  . 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 Weight loss    No past surgical history on file. Social History   Social History  . Marital status: Married    Spouse name: N/A  . Number of children: N/A  . Years of education: N/A   Social History Main Topics  . Smoking status: Current Some Day Smoker    Types: Cigars  . Smokeless tobacco: Never Used  . Alcohol use 6.0 oz/week    10 Shots of liquor per week  . Drug use: No  . Sexual activity: Yes   Other Topics Concern  . None   Social History Narrative   Regular exercise-yes   Allergies  Allergen Reactions  .  Codeine     REACTION: Swelling of his eyes  . Penicillins     REACTION: hives   Family History  Problem Relation Age of Onset  . Heart attack Father   . Colon cancer Father     ?  . Lung cancer Brother     Past medical history, social, surgical and family history all reviewed in electronic medical record.  No pertanent information unless stated regarding to the chief complaint.   Review of Systems:Review of systems updated and as accurate as of 05/13/16  No headache, visual changes, nausea, vomiting, diarrhea, constipation, dizziness, abdominal pain, skin rash, fevers, chills, night sweats, weight loss, swollen lymph nodes, body aches, joint swelling, muscle aches, chest pain, shortness of breath, mood changes.   Objective  Blood pressure 132/78, pulse 88, height 6' (1.829 m), weight 227 lb (103 kg), SpO2 97 %. Systems examined below as of 05/13/16   General: No apparent distress alert and oriented x3 mood and affect normal, dressed appropriately.  HEENT: Pupils equal, extraocular movements intact  Respiratory: Patient's speak in full sentences and does not appear short of breath  Cardiovascular: No lower extremity edema, non tender, no erythema  Skin: Warm dry intact with no signs of infection or rash on extremities or on axial skeleton.  Abdomen: Soft nontender  Neuro: Cranial nerves II through XII are intact, neurovascularly intact in all extremities with 2+ DTRs and 2+ pulses.  Lymph: No  lymphadenopathy of posterior or anterior cervical chain or axillae bilaterally.  Gait normal with good balance and coordination.  MSK:  Non tender with full range of motion and good stability and symmetric strength and tone of elbows, wrist, hip, knee and ankles bilaterally.  Shoulder: Right Inspection reveals no abnormalities, atrophy or asymmetry. Palpation is normal with no tenderness over AC joint or bicipital groove. ROM is full in all planes passively. Rotator cuff strength normal  throughout. signs of impingement with positive Neer and Hawkin's tests, but negative empty can sign. Speeds and Yergason's tests normal. No labral pathology noted with negative Obrien's, negative clunk and good stability. Normal scapular function observed. No painful arc and no drop arm sign. No apprehension sign  MSK US performed of: Right This study was ordered, performed, and interpreted by Terrilee FilesZach Kamaljit Hizer D.O.  Shoulder:   Supraspinatus:  Appears normal on long and transverse views, Bursal bulge seen with shoulder abduction on impingement view. Infraspinatus:  Appears normal on long and transverse views. Significant increase in Doppler flow Subscapularis:  Appears normal on long and transverse views. Positive bursa Teres Minor:  Appears normal on long and transverse views. AC joint:  Acromioclavicular arthritis noted Glenohumeral Joint:  Appears normal without effusion. Glenoid Labrum:  Intact without visualized tears. Biceps Tendon:  Appears normal on long and transverse views, no fraying of tendon, tendon located in intertubercular groove, no subluxation with shoulder internal or external rotation.  Impression: Subacromial bursitis  Procedure: Real-time Ultrasound Guided Injection of right glenohumeral joint Device: GE Logiq E  Ultrasound guided injection is preferred based studies that show increased duration, increased effect, greater accuracy, decreased procedural pain, increased response rate with ultrasound guided versus blind injection.  Verbal informed consent obtained.  Time-out conducted.  Noted no overlying erythema, induration, or other signs of local infection.  Skin prepped in a sterile fashion.  Local anesthesia: Topical Ethyl chloride.  With sterile technique and under real time ultrasound guidance:  Joint visualized.  23g 1  inch needle inserted posterior approach. Pictures taken for needle placement. Patient did have injection of 2 cc of 1% lidocaine, 2 cc of 0.5%  Marcaine, and 1.0 cc of Kenalog 40 mg/dL. Completed without difficulty  Pain immediately resolved suggesting accurate placement of the medication.  Advised to call if fevers/chills, erythema, induration, drainage, or persistent bleeding.  Images permanently stored and available for review in the ultrasound unit.  Impression: Technically successful ultrasound guided injection.   Procedure: Real-time Ultrasound Guided Injection of right acromioclavicular joint Device: GE Logiq E  Ultrasound guided injection is preferred based studies that show increased duration, increased effect, greater accuracy, decreased procedural pain, increased response rate, and decreased cost with ultrasound guided versus blind injection.  Verbal informed consent obtained.  Time-out conducted.  Noted no overlying erythema, induration, or other signs of local infection.  Skin prepped in a sterile fashion.  Local anesthesia: Topical Ethyl chloride.  With sterile technique and under real time ultrasound guidance:  With a 25-gauge half-inch no patient was injected with a total of 0.5 mL of 0.5% Marcaine and 0.5 mL of Kenalog 41 g/dL. Completed without difficulty  Pain immediately resolved suggesting accurate placement of the medication.  Advised to call if fevers/chills, erythema, induration, drainage, or persistent bleeding.  Images permanently stored and available for review in the ultrasound unit.  Impression: Technically successful ultrasound guided injection.   Impression and Recommendations:     This case required medical decision making of moderate complexity.  Note: This dictation was prepared with Dragon dictation along with smaller phrase technology. Any transcriptional errors that result from this process are unintentional.

## 2016-05-13 ENCOUNTER — Encounter: Payer: Self-pay | Admitting: Family Medicine

## 2016-05-13 ENCOUNTER — Ambulatory Visit: Payer: Self-pay

## 2016-05-13 ENCOUNTER — Ambulatory Visit (INDEPENDENT_AMBULATORY_CARE_PROVIDER_SITE_OTHER): Payer: 59 | Admitting: Family Medicine

## 2016-05-13 VITALS — BP 132/78 | HR 88 | Ht 72.0 in | Wt 227.0 lb

## 2016-05-13 DIAGNOSIS — M25511 Pain in right shoulder: Secondary | ICD-10-CM

## 2016-05-13 DIAGNOSIS — M7551 Bursitis of right shoulder: Secondary | ICD-10-CM

## 2016-05-13 DIAGNOSIS — M19011 Primary osteoarthritis, right shoulder: Secondary | ICD-10-CM | POA: Diagnosis not present

## 2016-05-13 DIAGNOSIS — G8929 Other chronic pain: Secondary | ICD-10-CM

## 2016-05-13 MED ORDER — AMLODIPINE BESYLATE 5 MG PO TABS: 5.0000 mg | ORAL_TABLET | Freq: Every day | ORAL | 3 refills | Status: AC

## 2016-05-13 MED ORDER — DICLOFENAC SODIUM 2 % TD SOLN: 2.0000 "application " | Freq: Two times a day (BID) | TRANSDERMAL | 3 refills | Status: AC

## 2016-05-13 MED ORDER — AMLODIPINE BESYLATE 5 MG PO TABS: 5.0000 mg | ORAL_TABLET | Freq: Every day | ORAL | 3 refills | Status: DC

## 2016-05-13 MED ORDER — LOSARTAN POTASSIUM-HCTZ 100-12.5 MG PO TABS: 1.0000 | ORAL_TABLET | Freq: Every day | ORAL | 3 refills | Status: DC

## 2016-05-13 NOTE — Assessment & Plan Note (Signed)
Given injection today and tolerated the procedure well. We discussed icing regimen. Patient will try topical anti-inflammatories. Discussed avoiding significant lifting across his body. Follow-up again in 4 weeks.

## 2016-05-13 NOTE — Patient Instructions (Signed)
Good to see you  Keep hands within peripheral vision with lifting Did 2 injections in you today  Ice 20 minutes 2 times daily. Usually after activity and before bed. Stay active otherwise  Have a great holiday  pennsaid pinkie amount topically 2 times daily as needed.    See me again in 3 weeks if not perfect

## 2016-05-13 NOTE — Assessment & Plan Note (Signed)
Patient given injection and tolerated the procedure well. Increasing range of motion immediately. Patient had significant decrease in pain as well. Patient will come back and see me again in 4 weeks for further evaluation. In the interim will continue with conservative therapy including home exercises, icing, topical anti-inflammatories are prescribed today.

## 2016-05-14 ENCOUNTER — Encounter: Payer: Self-pay | Admitting: Internal Medicine

## 2016-05-15 ENCOUNTER — Encounter: Payer: Self-pay | Admitting: Internal Medicine

## 2016-06-05 ENCOUNTER — Telehealth: Payer: Self-pay | Admitting: *Deleted

## 2016-06-05 NOTE — Telephone Encounter (Signed)
Pt Left msg on triage stating Dr. Katrinka BlazingSmith was going to refill his Losartan, but pharmacy never received script. Pls advise...Raechel Chute/lmb

## 2016-06-06 MED ORDER — LOSARTAN POTASSIUM-HCTZ 100-12.5 MG PO TABS: 1.0000 | ORAL_TABLET | Freq: Every day | ORAL | 0 refills | Status: AC

## 2016-06-06 NOTE — Telephone Encounter (Signed)
Patient lives in [place] now and can not be a patient of Dr. Yetta BarreJones anymore but patient is requesting refill of losartan.  Please follow back up in regard.

## 2016-06-06 NOTE — Telephone Encounter (Signed)
Spoke to pt, explained to him per dr Katrinka Blazingsmith okay to send in one more refill of losartan & then pt will have to followup with PCP. Pt understood.

## 2016-06-06 NOTE — Telephone Encounter (Signed)
Spoke to Dr. Katrinka BlazingSmith. Verbal to delete that additional refills. Gave verbal to [facility].  PCP okay with keeping pt.

## 2016-08-19 ENCOUNTER — Other Ambulatory Visit: Payer: Self-pay | Admitting: Family Medicine

## 2016-08-19 NOTE — Telephone Encounter (Signed)
Per dr Katrinka Blazingsmith in last phone note. Pt needs to see PCP to get refill.

## 2017-04-29 ENCOUNTER — Other Ambulatory Visit: Payer: Self-pay | Admitting: Family Medicine

## 2017-04-29 NOTE — Telephone Encounter (Signed)
Refill denied. Per dr Katrinka Blazingsmith pt needs to see PCP to have this med refilled.
